# Patient Record
Sex: Female | Born: 1935 | Race: White | Hispanic: No | Smoking: Former smoker
Health system: Southern US, Community
[De-identification: ages and names within clinical notes are randomized; demographics above are authoritative.]

## PROBLEM LIST (undated history)

## (undated) DIAGNOSIS — J449 Chronic obstructive pulmonary disease, unspecified: Secondary | ICD-10-CM

## (undated) DIAGNOSIS — C801 Malignant (primary) neoplasm, unspecified: Secondary | ICD-10-CM

## (undated) DIAGNOSIS — I509 Heart failure, unspecified: Secondary | ICD-10-CM

## (undated) DIAGNOSIS — M1990 Unspecified osteoarthritis, unspecified site: Secondary | ICD-10-CM

## (undated) DIAGNOSIS — E039 Hypothyroidism, unspecified: Secondary | ICD-10-CM

## (undated) DIAGNOSIS — N189 Chronic kidney disease, unspecified: Secondary | ICD-10-CM

## (undated) DIAGNOSIS — R040 Epistaxis: Secondary | ICD-10-CM

## (undated) DIAGNOSIS — Z111 Encounter for screening for respiratory tuberculosis: Secondary | ICD-10-CM

## (undated) DIAGNOSIS — I639 Cerebral infarction, unspecified: Secondary | ICD-10-CM

## (undated) DIAGNOSIS — Z950 Presence of cardiac pacemaker: Secondary | ICD-10-CM

## (undated) DIAGNOSIS — I10 Essential (primary) hypertension: Secondary | ICD-10-CM

## (undated) DIAGNOSIS — K219 Gastro-esophageal reflux disease without esophagitis: Secondary | ICD-10-CM

## (undated) DIAGNOSIS — J45909 Unspecified asthma, uncomplicated: Secondary | ICD-10-CM

## (undated) HISTORY — PX: APPENDECTOMY: SHX54

## (undated) HISTORY — PX: EYE SURGERY: SHX253

## (undated) HISTORY — PX: ABDOMINAL HYSTERECTOMY: SHX81

## (undated) HISTORY — PX: LUMBAR FUSION: SHX111

---
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
## 1989-07-04 DIAGNOSIS — C801 Malignant (primary) neoplasm, unspecified: Secondary | ICD-10-CM

## 1989-07-04 HISTORY — DX: Malignant (primary) neoplasm, unspecified: C80.1

## 1999-11-04 HISTORY — PX: BACK SURGERY: SHX140

## 2000-02-17 ENCOUNTER — Encounter: Admission: RE | Admit: 2000-02-17 | Discharge: 2000-02-17 | Payer: Self-pay | Admitting: Family Medicine

## 2000-02-17 ENCOUNTER — Encounter: Payer: Self-pay | Admitting: Family Medicine

## 2000-03-13 ENCOUNTER — Encounter: Payer: Self-pay | Admitting: Neurosurgery

## 2000-03-18 ENCOUNTER — Encounter: Payer: Self-pay | Admitting: Neurosurgery

## 2000-03-18 ENCOUNTER — Inpatient Hospital Stay (HOSPITAL_COMMUNITY): Admission: RE | Admit: 2000-03-18 | Discharge: 2000-03-21 | Payer: Self-pay | Admitting: Neurosurgery

## 2000-03-19 ENCOUNTER — Encounter: Payer: Self-pay | Admitting: Neurosurgery

## 2008-11-03 DIAGNOSIS — I639 Cerebral infarction, unspecified: Secondary | ICD-10-CM

## 2008-11-03 HISTORY — DX: Cerebral infarction, unspecified: I63.9

## 2015-02-02 HISTORY — PX: OTHER SURGICAL HISTORY: SHX169

## 2015-06-04 HISTORY — PX: INSERT / REPLACE / REMOVE PACEMAKER: SUR710

## 2016-03-13 ENCOUNTER — Other Ambulatory Visit: Payer: Self-pay | Admitting: Neurosurgery

## 2016-04-01 NOTE — Progress Notes (Addendum)
04/01/2016   1525pm  Spoke with son regarding any and all info he could give me today about his mother. PCP -  Dr. Bebe Shaggy    [phone] Pulmonary -  Dr. Alcus Dad  [phone]  Cardiologist - Dr. Coralee Pesa   Electrophysiologist - Dr. Ola Spurr   (both these doctors work for [facility]) [phone] Dr. Ola Spurr inserted a Medtronic pacer back in August 2016.   Last in house pacer check was 10/2015  Nephrologist - Dr. Stormy Fabian  [facility] [phone] Patient had stents placed in kidneys in March 2016  Endocrinologist - Dr. Posey Pronto  [phone] Spoke with [name] from Dr. Windy Carina office.  She is faxing all the info (clearance notes) she received from the above doctors today.  (she said she originally faxed it May 12th, but asked for re-fax) (I also faxed PPM / ICD sheet to [facility])

## 2016-04-01 NOTE — Pre-Procedure Instructions (Signed)
Stacie Roman  04/01/2016      [facility] [address], [place], [id number] [name] [address] Phone: [phone] Fax: [phone]    Your procedure is scheduled on Wednesday, June 7th.             (Posted surgery time 8:30 am - 12:26 pm)   Report to [facility] Admitting at 6:30 AM   Call this number if you have problems the morning of surgery:  [phone]   Remember:  Do not eat food or drink liquids after midnight Tuesday.             4-5 days prior to surgery, STOP taking any vitamins, herbal supplements, anti-inflammatories, blood thinners   Take these medicines the morning of surgery with A SIP OF WATER :   Levothyroxine, Labetalol, Procardia (NIFEDIPINE),  CLONIDINE,Please use your inhalers & nebulizer treatments that morning.   Do not wear jewelry, make-up or nail polish.  Do not wear lotions, powders, or perfumes.     Do not shave underarms & legs 48 hours prior to surgery.    Do not bring valuables to the hospital.  [facility] is not responsible for any belongings or valuables.  Contacts, dentures or bridgework may not be worn into surgery.  Leave your suitcase in the car.  After surgery it may be brought to your room. For patients admitted to the hospital, discharge time will be determined by your treatment team.  Name and phone number of your driver:   son  Please read over the following fact sheets that you were given. Pain Booklet, MRSA Information and Surgical Site Infection Prevention

## 2016-04-02 ENCOUNTER — Encounter (HOSPITAL_COMMUNITY)
Admission: RE | Admit: 2016-04-02 | Discharge: 2016-04-02 | Disposition: A | Discharge status: Home / Self Care | Payer: Medicare HMO | Source: Ambulatory Visit | Attending: Neurosurgery | Admitting: Neurosurgery

## 2016-04-02 ENCOUNTER — Encounter (HOSPITAL_COMMUNITY): Payer: Self-pay

## 2016-04-02 DIAGNOSIS — M4806 Spinal stenosis, lumbar region: Secondary | ICD-10-CM | POA: Insufficient documentation

## 2016-04-02 DIAGNOSIS — Z0183 Encounter for blood typing: Secondary | ICD-10-CM | POA: Insufficient documentation

## 2016-04-02 DIAGNOSIS — Z01812 Encounter for preprocedural laboratory examination: Secondary | ICD-10-CM | POA: Diagnosis not present

## 2016-04-02 HISTORY — DX: Malignant (primary) neoplasm, unspecified: C80.1

## 2016-04-02 HISTORY — DX: Hypothyroidism, unspecified: E03.9

## 2016-04-02 HISTORY — DX: Presence of cardiac pacemaker: Z95.0

## 2016-04-02 HISTORY — DX: Essential (primary) hypertension: I10

## 2016-04-02 HISTORY — DX: Cerebral infarction, unspecified: I63.9

## 2016-04-02 HISTORY — DX: Heart failure, unspecified: I50.9

## 2016-04-02 HISTORY — DX: Encounter for screening for respiratory tuberculosis: Z11.1

## 2016-04-02 HISTORY — DX: Unspecified osteoarthritis, unspecified site: M19.90

## 2016-04-02 HISTORY — DX: Chronic obstructive pulmonary disease, unspecified: J44.9

## 2016-04-02 HISTORY — DX: Gastro-esophageal reflux disease without esophagitis: K21.9

## 2016-04-02 HISTORY — DX: Epistaxis: R04.0

## 2016-04-02 HISTORY — DX: Chronic kidney disease, unspecified: N18.9

## 2016-04-02 HISTORY — DX: Unspecified asthma, uncomplicated: J45.909

## 2016-04-02 LAB — CBC
HEMATOCRIT: 31.3 % — AB (ref 36.0–46.0)
HEMOGLOBIN: 10.1 g/dL — AB (ref 12.0–15.0)
MCH: 30.1 pg (ref 26.0–34.0)
MCHC: 32.3 g/dL (ref 30.0–36.0)
MCV: 93.4 fL (ref 78.0–100.0)
Platelets: 290 10*3/uL (ref 150–400)
RBC: 3.35 MIL/uL — ABNORMAL LOW (ref 3.87–5.11)
RDW: 12.8 % (ref 11.5–15.5)
WBC: 8.6 10*3/uL (ref 4.0–10.5)

## 2016-04-02 LAB — BASIC METABOLIC PANEL
ANION GAP: 9 (ref 5–15)
BUN: 40 mg/dL — AB (ref 6–20)
CHLORIDE: 102 mmol/L (ref 101–111)
CO2: 25 mmol/L (ref 22–32)
Calcium: 9.4 mg/dL (ref 8.9–10.3)
Creatinine, Ser: 2.58 mg/dL — ABNORMAL HIGH (ref 0.44–1.00)
GFR calc Af Amer: 19 mL/min — ABNORMAL LOW (ref >60)
GFR calc non Af Amer: 16 mL/min — ABNORMAL LOW (ref >60)
Glucose, Bld: 101 mg/dL — ABNORMAL HIGH (ref 65–99)
POTASSIUM: 3.8 mmol/L (ref 3.5–5.1)
SODIUM: 136 mmol/L (ref 135–145)

## 2016-04-02 LAB — SURGICAL PCR SCREEN
MRSA, PCR: NEGATIVE
STAPHYLOCOCCUS AUREUS: NEGATIVE

## 2016-04-02 LAB — ABO/RH: ABO/RH(D): AB POS

## 2016-04-02 NOTE — Progress Notes (Signed)
A. Kabbe,anesth. Consult called for due to MD order & alert of her medical needs.

## 2016-04-03 ENCOUNTER — Encounter (HOSPITAL_COMMUNITY): Payer: Self-pay

## 2016-04-03 NOTE — Progress Notes (Signed)
Anesthesia Chart Review:  Pt is an 82 year old female scheduled for L3-4 PLIF, removal of hardware on 04/09/2016 with Dr. Saintclair Halsted.   Nephrologist is Dr. Thereasa Distance. PCP is Dr. [name]. Sees Elijio Miles, PA/Dr. Loletha Carrow with pulmonology. Endocrinologist is Dr. Tonette Bihari.  Cardiologist is Dr. Milana Huntsman. EP cardiologist is Dr. Adrian Prows. (Note that documentation from all providers can be found in care everywhere).   PMH includes:  CHF, pacemaker (Medtronic, placed 06/15/15), HTN, stroke (2010), CKD (stage 4), B renal artery stenosis (s/p B renal stents), asthma, COPD, hypothyroidsim, treated forTB in 1950's, GERD. Former smoker. BMI 35  Medications include: ASA, clonidine, dulera, imdur, labetalol, levothyroxine, nifedipine, torsemide, albuterol  Preoperative labs reviewed.  Cr 2.58, BUN 40. Clearance note from nephrology indicates pt's baseline Cr is 2.9.   Chest x-ray 06/16/15 reviewed (care everywhere). No active cardiopulmonary disease. Dual lead cardiac pacemaker in place.   EKG 01/24/16 requested. If it does not arrive in time, will need to obtain EKG DOS.   Echo 12/21/14 (care everywhere):  - The left ventricular size is normal. Basal left ventricular septal hypertrophy.  Left ventricular systolic function is normal. LV ejection fraction = 55-60%. No segmental wall motion abnormalities seen in the left ventricle. Left ventricular filling pattern is impaired. - The right ventricle is normal in size and function. - There is mild mitral annular calcification. - The aortic root is normal size. - The IVC is normal in size with an inspiratory collapse of greater then 50%, suggesting normal right atrial pressure. - There is no pericardial effusion.  Pt has clearance from her PCP, cardiologist, pulmonologist (who recommends a post-op CXR), nephrologist and endocrinologist.   Perioperative pacemaker form pending.   If no changes, I anticipate pt can proceed  with surgery as scheduled.   Willeen Cass, FNP-BC [facility]/Anesthesiology Phone: [phone] 04/03/2016 1:44 PM

## 2016-04-08 MED ORDER — DEXAMETHASONE SODIUM PHOSPHATE 10 MG/ML IJ SOLN
10.0000 mg | INTRAMUSCULAR | Status: DC
  Filled 2016-04-08: qty 1

## 2016-04-08 MED ORDER — CEFAZOLIN SODIUM-DEXTROSE 2-4 GM/100ML-% IV SOLN
2.0000 g | INTRAVENOUS | Status: AC
  Administered 2016-04-09: 2 g via INTRAVENOUS
  Filled 2016-04-08: qty 100

## 2016-04-09 ENCOUNTER — Encounter (HOSPITAL_COMMUNITY)
Admission: RE | Disposition: A | Discharge status: Home Health | Payer: Self-pay | Source: Ambulatory Visit | Attending: Neurosurgery

## 2016-04-09 ENCOUNTER — Inpatient Hospital Stay (HOSPITAL_COMMUNITY): Payer: Medicare HMO | Admitting: Anesthesiology

## 2016-04-09 ENCOUNTER — Inpatient Hospital Stay (HOSPITAL_COMMUNITY): Payer: Medicare HMO | Admitting: Emergency Medicine

## 2016-04-09 ENCOUNTER — Inpatient Hospital Stay (HOSPITAL_COMMUNITY): Payer: Medicare HMO

## 2016-04-09 ENCOUNTER — Encounter (HOSPITAL_COMMUNITY): Payer: Self-pay | Admitting: Surgery

## 2016-04-09 ENCOUNTER — Inpatient Hospital Stay (HOSPITAL_COMMUNITY)
Admission: RE | Admit: 2016-04-09 | Discharge: 2016-04-20 | DRG: 460 | Disposition: A | Discharge status: Home Health | Payer: Medicare HMO | Source: Ambulatory Visit | Attending: Neurosurgery | Admitting: Neurosurgery

## 2016-04-09 DIAGNOSIS — J449 Chronic obstructive pulmonary disease, unspecified: Secondary | ICD-10-CM | POA: Diagnosis present

## 2016-04-09 DIAGNOSIS — D649 Anemia, unspecified: Secondary | ICD-10-CM | POA: Diagnosis not present

## 2016-04-09 DIAGNOSIS — Y792 Prosthetic and other implants, materials and accessory orthopedic devices associated with adverse incidents: Secondary | ICD-10-CM | POA: Diagnosis not present

## 2016-04-09 DIAGNOSIS — N184 Chronic kidney disease, stage 4 (severe): Secondary | ICD-10-CM | POA: Diagnosis present

## 2016-04-09 DIAGNOSIS — Z87891 Personal history of nicotine dependence: Secondary | ICD-10-CM | POA: Diagnosis not present

## 2016-04-09 DIAGNOSIS — I130 Hypertensive heart and chronic kidney disease with heart failure and stage 1 through stage 4 chronic kidney disease, or unspecified chronic kidney disease: Secondary | ICD-10-CM | POA: Diagnosis present

## 2016-04-09 DIAGNOSIS — Z8673 Personal history of transient ischemic attack (TIA), and cerebral infarction without residual deficits: Secondary | ICD-10-CM

## 2016-04-09 DIAGNOSIS — T84226A Displacement of internal fixation device of vertebrae, initial encounter: Secondary | ICD-10-CM | POA: Diagnosis not present

## 2016-04-09 DIAGNOSIS — Z885 Allergy status to narcotic agent status: Secondary | ICD-10-CM | POA: Diagnosis not present

## 2016-04-09 DIAGNOSIS — N179 Acute kidney failure, unspecified: Secondary | ICD-10-CM

## 2016-04-09 DIAGNOSIS — Z886 Allergy status to analgesic agent status: Secondary | ICD-10-CM | POA: Diagnosis not present

## 2016-04-09 DIAGNOSIS — D62 Acute posthemorrhagic anemia: Secondary | ICD-10-CM | POA: Diagnosis not present

## 2016-04-09 DIAGNOSIS — K5900 Constipation, unspecified: Secondary | ICD-10-CM | POA: Diagnosis not present

## 2016-04-09 DIAGNOSIS — I509 Heart failure, unspecified: Secondary | ICD-10-CM

## 2016-04-09 DIAGNOSIS — Z79899 Other long term (current) drug therapy: Secondary | ICD-10-CM | POA: Diagnosis not present

## 2016-04-09 DIAGNOSIS — Y92239 Unspecified place in hospital as the place of occurrence of the external cause: Secondary | ICD-10-CM | POA: Diagnosis not present

## 2016-04-09 DIAGNOSIS — E039 Hypothyroidism, unspecified: Secondary | ICD-10-CM | POA: Diagnosis present

## 2016-04-09 DIAGNOSIS — Z9981 Dependence on supplemental oxygen: Secondary | ICD-10-CM | POA: Diagnosis not present

## 2016-04-09 DIAGNOSIS — K567 Ileus, unspecified: Secondary | ICD-10-CM

## 2016-04-09 DIAGNOSIS — R531 Weakness: Secondary | ICD-10-CM

## 2016-04-09 DIAGNOSIS — M1990 Unspecified osteoarthritis, unspecified site: Secondary | ICD-10-CM | POA: Diagnosis present

## 2016-04-09 DIAGNOSIS — M4316 Spondylolisthesis, lumbar region: Secondary | ICD-10-CM | POA: Diagnosis present

## 2016-04-09 DIAGNOSIS — M4806 Spinal stenosis, lumbar region: Secondary | ICD-10-CM | POA: Diagnosis present

## 2016-04-09 DIAGNOSIS — Z7982 Long term (current) use of aspirin: Secondary | ICD-10-CM | POA: Diagnosis not present

## 2016-04-09 DIAGNOSIS — Z419 Encounter for procedure for purposes other than remedying health state, unspecified: Secondary | ICD-10-CM

## 2016-04-09 DIAGNOSIS — K219 Gastro-esophageal reflux disease without esophagitis: Secondary | ICD-10-CM | POA: Diagnosis present

## 2016-04-09 DIAGNOSIS — M47816 Spondylosis without myelopathy or radiculopathy, lumbar region: Secondary | ICD-10-CM | POA: Diagnosis present

## 2016-04-09 DIAGNOSIS — Z888 Allergy status to other drugs, medicaments and biological substances status: Secondary | ICD-10-CM

## 2016-04-09 DIAGNOSIS — I5030 Unspecified diastolic (congestive) heart failure: Secondary | ICD-10-CM | POA: Diagnosis not present

## 2016-04-09 DIAGNOSIS — I5032 Chronic diastolic (congestive) heart failure: Secondary | ICD-10-CM | POA: Diagnosis present

## 2016-04-09 SURGERY — POSTERIOR LUMBAR FUSION 1 WITH HARDWARE REMOVAL
Anesthesia: General | Site: Spine Lumbar

## 2016-04-09 MED ORDER — HYDROMORPHONE HCL 1 MG/ML IJ SOLN
0.5000 mg | INTRAMUSCULAR | Status: DC | PRN
  Administered 2016-04-09 – 2016-04-16 (×6): 1 mg via INTRAVENOUS
  Filled 2016-04-09 (×7): qty 1

## 2016-04-09 MED ORDER — ISOSORBIDE MONONITRATE ER 60 MG PO TB24
90.0000 mg | ORAL_TABLET | Freq: Every day | ORAL | Status: DC
  Administered 2016-04-09 – 2016-04-19 (×10): 90 mg via ORAL
  Filled 2016-04-09 (×2): qty 3
  Filled 2016-04-09: qty 1
  Filled 2016-04-09 (×3): qty 3
  Filled 2016-04-09: qty 1
  Filled 2016-04-09: qty 3
  Filled 2016-04-09: qty 1
  Filled 2016-04-09: qty 3

## 2016-04-09 MED ORDER — ISOSORBIDE MONONITRATE ER 60 MG PO TB24: 60.0000 mg | ORAL_TABLET | Freq: Every day | ORAL | Status: DC

## 2016-04-09 MED ORDER — BISACODYL 5 MG PO TBEC: 5.0000 mg | DELAYED_RELEASE_TABLET | Freq: Two times a day (BID) | ORAL | Status: DC

## 2016-04-09 MED ORDER — BUPIVACAINE LIPOSOME 1.3 % IJ SUSP
INTRAMUSCULAR | Status: DC | PRN
  Administered 2016-04-09: 20 mL

## 2016-04-09 MED ORDER — LEVOTHYROXINE SODIUM 50 MCG PO TABS
50.0000 ug | ORAL_TABLET | Freq: Every day | ORAL | Status: DC
  Administered 2016-04-10 – 2016-04-19 (×10): 50 ug via ORAL
  Filled 2016-04-09 (×10): qty 1

## 2016-04-09 MED ORDER — ACETAMINOPHEN 650 MG RE SUPP: 650.0000 mg | RECTAL | Status: DC | PRN

## 2016-04-09 MED ORDER — VANCOMYCIN HCL 1000 MG IV SOLR
INTRAVENOUS | Status: DC | PRN
  Administered 2016-04-09: 1000 mg via TOPICAL

## 2016-04-09 MED ORDER — PROPOFOL 10 MG/ML IV BOLUS
INTRAVENOUS | Status: DC | PRN
  Administered 2016-04-09: 150 mg via INTRAVENOUS

## 2016-04-09 MED ORDER — ONDANSETRON HCL 4 MG/2ML IJ SOLN
INTRAMUSCULAR | Status: AC
  Filled 2016-04-09: qty 2

## 2016-04-09 MED ORDER — DOCUSATE SODIUM 100 MG PO CAPS
200.0000 mg | ORAL_CAPSULE | Freq: Two times a day (BID) | ORAL | Status: DC
  Administered 2016-04-09 – 2016-04-20 (×22): 200 mg via ORAL
  Filled 2016-04-09 (×23): qty 2

## 2016-04-09 MED ORDER — ACETAMINOPHEN 325 MG PO TABS
650.0000 mg | ORAL_TABLET | ORAL | Status: DC | PRN
  Administered 2016-04-14 – 2016-04-20 (×12): 650 mg via ORAL
  Filled 2016-04-09 (×12): qty 2

## 2016-04-09 MED ORDER — ROCURONIUM BROMIDE 50 MG/5ML IV SOLN
INTRAVENOUS | Status: AC
  Filled 2016-04-09: qty 2

## 2016-04-09 MED ORDER — ALBUTEROL SULFATE (2.5 MG/3ML) 0.083% IN NEBU
2.0000 mL | INHALATION_SOLUTION | Freq: Four times a day (QID) | RESPIRATORY_TRACT | Status: DC | PRN
  Administered 2016-04-15 – 2016-04-19 (×3): 2 mL via RESPIRATORY_TRACT
  Filled 2016-04-09 (×3): qty 3

## 2016-04-09 MED ORDER — PHENOL 1.4 % MT LIQD: 1.0000 | OROMUCOSAL | Status: DC | PRN

## 2016-04-09 MED ORDER — FENTANYL CITRATE (PF) 100 MCG/2ML IJ SOLN
INTRAMUSCULAR | Status: AC
  Administered 2016-04-09: 50 ug via INTRAVENOUS
  Filled 2016-04-09: qty 2

## 2016-04-09 MED ORDER — SODIUM CHLORIDE 0.9% FLUSH: 3.0000 mL | INTRAVENOUS | Status: DC | PRN

## 2016-04-09 MED ORDER — ACETAMINOPHEN 500 MG PO TABS
1000.0000 mg | ORAL_TABLET | ORAL | Status: DC
  Administered 2016-04-11 – 2016-04-16 (×6): 1000 mg via ORAL
  Filled 2016-04-09 (×7): qty 2

## 2016-04-09 MED ORDER — OXYCODONE-ACETAMINOPHEN 5-325 MG PO TABS
1.0000 | ORAL_TABLET | ORAL | Status: DC | PRN
  Administered 2016-04-09: 2 via ORAL
  Administered 2016-04-09: 1 via ORAL
  Administered 2016-04-10 – 2016-04-12 (×8): 2 via ORAL
  Administered 2016-04-15 (×2): 1 via ORAL
  Filled 2016-04-09 (×8): qty 2
  Filled 2016-04-09: qty 1
  Filled 2016-04-09 (×3): qty 2

## 2016-04-09 MED ORDER — FENTANYL CITRATE (PF) 250 MCG/5ML IJ SOLN
INTRAMUSCULAR | Status: AC
  Filled 2016-04-09: qty 10

## 2016-04-09 MED ORDER — LIDOCAINE HCL (CARDIAC) 20 MG/ML IV SOLN
INTRAVENOUS | Status: DC | PRN
  Administered 2016-04-09: 40 mg via INTRAVENOUS

## 2016-04-09 MED ORDER — OSTEO BI-FLEX ADV JOINT SHIELD PO TABS: 2.0000 | ORAL_TABLET | ORAL | Status: DC

## 2016-04-09 MED ORDER — OXYBUTYNIN CHLORIDE ER 10 MG PO TB24
10.0000 mg | ORAL_TABLET | Freq: Every day | ORAL | Status: DC
  Administered 2016-04-09 – 2016-04-19 (×10): 10 mg via ORAL
  Filled 2016-04-09 (×12): qty 1

## 2016-04-09 MED ORDER — ROCURONIUM BROMIDE 100 MG/10ML IV SOLN
INTRAVENOUS | Status: DC | PRN
  Administered 2016-04-09: 50 mg via INTRAVENOUS
  Administered 2016-04-09 (×2): 10 mg via INTRAVENOUS

## 2016-04-09 MED ORDER — BUPIVACAINE LIPOSOME 1.3 % IJ SUSP
20.0000 mL | Freq: Once | INTRAMUSCULAR | Status: DC
  Filled 2016-04-09: qty 20

## 2016-04-09 MED ORDER — MENTHOL 3 MG MT LOZG: 1.0000 | LOZENGE | OROMUCOSAL | Status: DC | PRN

## 2016-04-09 MED ORDER — LIDOCAINE-EPINEPHRINE 1 %-1:100000 IJ SOLN
INTRAMUSCULAR | Status: DC | PRN
  Administered 2016-04-09: 10 mL

## 2016-04-09 MED ORDER — LACTATED RINGERS IV SOLN
INTRAVENOUS | Status: DC | PRN
  Administered 2016-04-09 (×2): via INTRAVENOUS

## 2016-04-09 MED ORDER — MOMETASONE FURO-FORMOTEROL FUM 200-5 MCG/ACT IN AERO
2.0000 | INHALATION_SPRAY | Freq: Two times a day (BID) | RESPIRATORY_TRACT | Status: DC
  Administered 2016-04-09 – 2016-04-19 (×17): 2 via RESPIRATORY_TRACT
  Filled 2016-04-09: qty 8.8

## 2016-04-09 MED ORDER — FENTANYL CITRATE (PF) 100 MCG/2ML IJ SOLN
INTRAMUSCULAR | Status: DC | PRN
  Administered 2016-04-09: 50 ug via INTRAVENOUS
  Administered 2016-04-09: 100 ug via INTRAVENOUS
  Administered 2016-04-09 (×4): 50 ug via INTRAVENOUS

## 2016-04-09 MED ORDER — DEXAMETHASONE SODIUM PHOSPHATE 10 MG/ML IJ SOLN
INTRAMUSCULAR | Status: DC | PRN
  Administered 2016-04-09: 10 mg via INTRAVENOUS

## 2016-04-09 MED ORDER — BISACODYL 5 MG PO TBEC
10.0000 mg | DELAYED_RELEASE_TABLET | Freq: Every day | ORAL | Status: DC
  Administered 2016-04-09 – 2016-04-19 (×11): 10 mg via ORAL
  Filled 2016-04-09 (×11): qty 2

## 2016-04-09 MED ORDER — NIFEDIPINE ER 60 MG PO TB24
60.0000 mg | ORAL_TABLET | Freq: Two times a day (BID) | ORAL | Status: DC
  Administered 2016-04-10 – 2016-04-20 (×20): 60 mg via ORAL
  Filled 2016-04-09 (×26): qty 1

## 2016-04-09 MED ORDER — FENTANYL CITRATE (PF) 100 MCG/2ML IJ SOLN
25.0000 ug | INTRAMUSCULAR | Status: DC | PRN
  Administered 2016-04-09: 25 ug via INTRAVENOUS
  Administered 2016-04-09 (×2): 50 ug via INTRAVENOUS
  Administered 2016-04-09: 25 ug via INTRAVENOUS
  Administered 2016-04-09: 50 ug via INTRAVENOUS

## 2016-04-09 MED ORDER — LIDOCAINE 2% (20 MG/ML) 5 ML SYRINGE
INTRAMUSCULAR | Status: AC
  Filled 2016-04-09: qty 5

## 2016-04-09 MED ORDER — ONDANSETRON HCL 4 MG/2ML IJ SOLN
4.0000 mg | INTRAMUSCULAR | Status: DC | PRN
  Administered 2016-04-13: 4 mg via INTRAVENOUS
  Filled 2016-04-09: qty 2

## 2016-04-09 MED ORDER — VANCOMYCIN HCL 1000 MG IV SOLR
INTRAVENOUS | Status: AC
  Filled 2016-04-09: qty 1000

## 2016-04-09 MED ORDER — SODIUM CHLORIDE 0.9 % IV SOLN: 250.0000 mL | INTRAVENOUS | Status: DC

## 2016-04-09 MED ORDER — SUGAMMADEX SODIUM 200 MG/2ML IV SOLN
INTRAVENOUS | Status: DC | PRN
  Administered 2016-04-09: 200 mg via INTRAVENOUS

## 2016-04-09 MED ORDER — DOCUSATE SODIUM 100 MG PO CAPS: 100.0000 mg | ORAL_CAPSULE | Freq: Two times a day (BID) | ORAL | Status: DC

## 2016-04-09 MED ORDER — LABETALOL HCL 200 MG PO TABS
400.0000 mg | ORAL_TABLET | Freq: Three times a day (TID) | ORAL | Status: DC
  Administered 2016-04-09 – 2016-04-20 (×29): 400 mg via ORAL
  Filled 2016-04-09 (×2): qty 2
  Filled 2016-04-09: qty 4
  Filled 2016-04-09 (×6): qty 2
  Filled 2016-04-09: qty 4
  Filled 2016-04-09 (×3): qty 2
  Filled 2016-04-09: qty 4
  Filled 2016-04-09 (×18): qty 2

## 2016-04-09 MED ORDER — ACYCLOVIR 800 MG PO TABS: 800.0000 mg | ORAL_TABLET | Freq: Three times a day (TID) | ORAL | Status: DC | PRN

## 2016-04-09 MED ORDER — 0.9 % SODIUM CHLORIDE (POUR BTL) OPTIME
TOPICAL | Status: DC | PRN
  Administered 2016-04-09: 1000 mL

## 2016-04-09 MED ORDER — ONDANSETRON HCL 4 MG/2ML IJ SOLN
4.0000 mg | Freq: Once | INTRAMUSCULAR | Status: AC | PRN
  Administered 2016-04-09: 4 mg via INTRAVENOUS

## 2016-04-09 MED ORDER — BISACODYL 5 MG PO TBEC
5.0000 mg | DELAYED_RELEASE_TABLET | Freq: Every day | ORAL | Status: DC
  Administered 2016-04-09 – 2016-04-19 (×11): 5 mg via ORAL
  Filled 2016-04-09 (×12): qty 1

## 2016-04-09 MED ORDER — ONDANSETRON HCL 4 MG/2ML IJ SOLN
INTRAMUSCULAR | Status: DC | PRN
  Administered 2016-04-09: 4 mg via INTRAVENOUS

## 2016-04-09 MED ORDER — LORATADINE 10 MG PO TABS
10.0000 mg | ORAL_TABLET | ORAL | Status: DC
  Administered 2016-04-10 – 2016-04-20 (×11): 10 mg via ORAL
  Filled 2016-04-09 (×12): qty 1

## 2016-04-09 MED ORDER — CYCLOBENZAPRINE HCL 10 MG PO TABS
10.0000 mg | ORAL_TABLET | Freq: Three times a day (TID) | ORAL | Status: DC | PRN
  Administered 2016-04-09 – 2016-04-18 (×12): 10 mg via ORAL
  Filled 2016-04-09 (×14): qty 1

## 2016-04-09 MED ORDER — ASPIRIN EC 81 MG PO TBEC
81.0000 mg | DELAYED_RELEASE_TABLET | ORAL | Status: DC
  Administered 2016-04-10 – 2016-04-20 (×11): 81 mg via ORAL
  Filled 2016-04-09 (×14): qty 1

## 2016-04-09 MED ORDER — CLONIDINE HCL 0.1 MG PO TABS
0.1000 mg | ORAL_TABLET | Freq: Two times a day (BID) | ORAL | Status: DC
  Administered 2016-04-10 – 2016-04-20 (×21): 0.1 mg via ORAL
  Filled 2016-04-09 (×23): qty 1

## 2016-04-09 MED ORDER — TORSEMIDE 20 MG PO TABS
40.0000 mg | ORAL_TABLET | ORAL | Status: DC
  Administered 2016-04-10 – 2016-04-14 (×5): 40 mg via ORAL
  Filled 2016-04-09 (×7): qty 2

## 2016-04-09 MED ORDER — THROMBIN 20000 UNITS EX SOLR
CUTANEOUS | Status: DC | PRN
  Administered 2016-04-09: 10:00:00 via TOPICAL

## 2016-04-09 MED ORDER — SODIUM CHLORIDE 0.9% FLUSH
3.0000 mL | Freq: Two times a day (BID) | INTRAVENOUS | Status: DC
  Administered 2016-04-09 – 2016-04-14 (×7): 3 mL via INTRAVENOUS

## 2016-04-09 MED ORDER — SODIUM CHLORIDE 0.9 % IR SOLN
Status: DC | PRN
  Administered 2016-04-09: 10:00:00

## 2016-04-09 MED ORDER — CEFAZOLIN SODIUM 1-5 GM-% IV SOLN
1.0000 g | Freq: Two times a day (BID) | INTRAVENOUS | Status: AC
  Administered 2016-04-09 – 2016-04-11 (×4): 1 g via INTRAVENOUS
  Filled 2016-04-09 (×4): qty 50

## 2016-04-09 MED ORDER — PROPOFOL 10 MG/ML IV BOLUS
INTRAVENOUS | Status: AC
  Filled 2016-04-09: qty 20

## 2016-04-09 MED ORDER — SENNOSIDES-DOCUSATE SODIUM 8.6-50 MG PO TABS
1.0000 | ORAL_TABLET | Freq: Every evening | ORAL | Status: DC | PRN
  Administered 2016-04-18: 1 via ORAL
  Filled 2016-04-09 (×2): qty 1

## 2016-04-09 MED ORDER — VITAMIN D 1000 UNITS PO TABS
5000.0000 [IU] | ORAL_TABLET | ORAL | Status: DC
  Administered 2016-04-10 – 2016-04-20 (×10): 5000 [IU] via ORAL
  Filled 2016-04-09 (×14): qty 5

## 2016-04-09 MED ORDER — MIDAZOLAM HCL 2 MG/2ML IJ SOLN
INTRAMUSCULAR | Status: AC
  Filled 2016-04-09: qty 2

## 2016-04-09 SURGICAL SUPPLY — 73 items
APL SKNCLS STERI-STRIP NONHPOA (GAUZE/BANDAGES/DRESSINGS) ×1
BAG DECANTER FOR FLEXI CONT (MISCELLANEOUS) ×3 IMPLANT
BENZOIN TINCTURE PRP APPL 2/3 (GAUZE/BANDAGES/DRESSINGS) ×3 IMPLANT
BLADE CLIPPER SURG (BLADE) IMPLANT
BLADE SURG 11 STRL SS (BLADE) ×3 IMPLANT
BRUSH SCRUB EZ PLAIN DRY (MISCELLANEOUS) ×3 IMPLANT
BUR MATCHSTICK NEURO 3.0 LAGG (BURR) ×3 IMPLANT
BUR PRECISION FLUTE 6.0 (BURR) ×3 IMPLANT
CAGE RISE 11-17-15 10X22 (Cage) ×4 IMPLANT
CANISTER SUCT 3000ML PPV (MISCELLANEOUS) ×3 IMPLANT
CAP LOCKING THREADED (Cap) ×8 IMPLANT
CLOSURE WOUND 1/2 X4 (GAUZE/BANDAGES/DRESSINGS) ×1
CONT SPEC 4OZ CLIKSEAL STRL BL (MISCELLANEOUS) ×3 IMPLANT
COVER BACK TABLE 24X17X13 BIG (DRAPES) IMPLANT
COVER BACK TABLE 60X90IN (DRAPES) ×3 IMPLANT
DECANTER SPIKE VIAL GLASS SM (MISCELLANEOUS) ×1 IMPLANT
DRAPE C-ARM 42X72 X-RAY (DRAPES) ×3 IMPLANT
DRAPE C-ARMOR (DRAPES) ×3 IMPLANT
DRAPE LAPAROTOMY 100X72X124 (DRAPES) ×3 IMPLANT
DRAPE POUCH INSTRU U-SHP 10X18 (DRAPES) ×3 IMPLANT
DRAPE PROXIMA HALF (DRAPES) ×4 IMPLANT
DRAPE SURG 17X23 STRL (DRAPES) ×3 IMPLANT
DRSG OPSITE 4X5.5 SM (GAUZE/BANDAGES/DRESSINGS) ×2 IMPLANT
DRSG OPSITE POSTOP 4X8 (GAUZE/BANDAGES/DRESSINGS) ×2 IMPLANT
DURAPREP 26ML APPLICATOR (WOUND CARE) ×3 IMPLANT
ELECT BLADE 4.0 EZ CLEAN MEGAD (MISCELLANEOUS) ×3
ELECT REM PT RETURN 9FT ADLT (ELECTROSURGICAL) ×3
ELECTRODE BLDE 4.0 EZ CLN MEGD (MISCELLANEOUS) IMPLANT
ELECTRODE REM PT RTRN 9FT ADLT (ELECTROSURGICAL) ×1 IMPLANT
EVACUATOR 3/16  PVC DRAIN (DRAIN) ×2
EVACUATOR 3/16 PVC DRAIN (DRAIN) ×1 IMPLANT
GAUZE SPONGE 4X4 12PLY STRL (GAUZE/BANDAGES/DRESSINGS) ×3 IMPLANT
GAUZE SPONGE 4X4 16PLY XRAY LF (GAUZE/BANDAGES/DRESSINGS) ×2 IMPLANT
GLOVE BIO SURGEON STRL SZ8 (GLOVE) ×6 IMPLANT
GLOVE BIOGEL PI IND STRL 7.5 (GLOVE) IMPLANT
GLOVE BIOGEL PI INDICATOR 7.5 (GLOVE) ×4
GLOVE ECLIPSE 7.5 STRL STRAW (GLOVE) IMPLANT
GLOVE INDICATOR 8.5 STRL (GLOVE) ×6 IMPLANT
GLOVE SURG SS PI 6.5 STRL IVOR (GLOVE) ×6 IMPLANT
GLOVE SURG SS PI 7.0 STRL IVOR (GLOVE) ×2 IMPLANT
GOWN STRL REUS W/ TWL LRG LVL3 (GOWN DISPOSABLE) IMPLANT
GOWN STRL REUS W/ TWL XL LVL3 (GOWN DISPOSABLE) ×2 IMPLANT
GOWN STRL REUS W/TWL 2XL LVL3 (GOWN DISPOSABLE) IMPLANT
GOWN STRL REUS W/TWL LRG LVL3 (GOWN DISPOSABLE) ×6
GOWN STRL REUS W/TWL XL LVL3 (GOWN DISPOSABLE) ×6
KIT BASIN OR (CUSTOM PROCEDURE TRAY) ×3 IMPLANT
KIT INFUSE SMALL (Orthopedic Implant) ×2 IMPLANT
KIT ROOM TURNOVER OR (KITS) ×3 IMPLANT
LIQUID BAND (GAUZE/BANDAGES/DRESSINGS) ×3 IMPLANT
NDL HYPO 21X1.5 SAFETY (NEEDLE) ×1 IMPLANT
NDL HYPO 25X1 1.5 SAFETY (NEEDLE) ×1 IMPLANT
NEEDLE HYPO 21X1.5 SAFETY (NEEDLE) ×3 IMPLANT
NEEDLE HYPO 25X1 1.5 SAFETY (NEEDLE) ×3 IMPLANT
NS IRRIG 1000ML POUR BTL (IV SOLUTION) ×3 IMPLANT
PACK LAMINECTOMY NEURO (CUSTOM PROCEDURE TRAY) ×3 IMPLANT
PAD ARMBOARD 7.5X6 YLW CONV (MISCELLANEOUS) ×9 IMPLANT
PUTTY BONE DBX 5CC MIX (Putty) ×2 IMPLANT
ROD 40MM SPINAL (Rod) ×4 IMPLANT
SCREW AMP MODULAR CREO 6.5X45 (Screw) ×4 IMPLANT
SCREW SPINE CREO AMP 6.5X50 (Screw) ×4 IMPLANT
SPONGE LAP 4X18 X RAY DECT (DISPOSABLE) IMPLANT
SPONGE SURGIFOAM ABS GEL 100 (HEMOSTASIS) ×3 IMPLANT
STRIP CLOSURE SKIN 1/2X4 (GAUZE/BANDAGES/DRESSINGS) ×3 IMPLANT
SUT VIC AB 0 CT1 18XCR BRD8 (SUTURE) ×2 IMPLANT
SUT VIC AB 0 CT1 8-18 (SUTURE) ×6
SUT VIC AB 2-0 CT1 18 (SUTURE) ×3 IMPLANT
SUT VIC AB 4-0 PS2 27 (SUTURE) ×3 IMPLANT
SYR 20CC LL (SYRINGE) ×2 IMPLANT
TOWEL OR 17X24 6PK STRL BLUE (TOWEL DISPOSABLE) ×3 IMPLANT
TOWEL OR 17X26 10 PK STRL BLUE (TOWEL DISPOSABLE) ×3 IMPLANT
TRAY FOLEY W/METER SILVER 16FR (SET/KITS/TRAYS/PACK) ×3 IMPLANT
TULIP CREP AMP 5.5MM (Orthopedic Implant) ×8 IMPLANT
WATER STERILE IRR 1000ML POUR (IV SOLUTION) ×3 IMPLANT

## 2016-04-09 NOTE — Anesthesia Postprocedure Evaluation (Signed)
Anesthesia Post Note  Patient: Stacie Roman  Procedure(s) Performed: Procedure(s) (LRB): LUMBAR THREE-FOUR POSTERIOR LUMBAR FUSION WITH INTERBODY AND HARDWARE REMOVAL  (N/A)  Patient location during evaluation: PACU Anesthesia Type: General Level of consciousness: awake, awake and alert and oriented Pain management: pain level controlled Vital Signs Assessment: post-procedure vital signs reviewed and stable Respiratory status: spontaneous breathing, nonlabored ventilation and respiratory function stable Cardiovascular status: blood pressure returned to baseline Anesthetic complications: no    Last Vitals:  Filed Vitals:   04/09/16 1500 04/09/16 1615  BP:  220/71  Pulse: 59 63  Temp: 36.2 C   Resp: 16 18    Last Pain:  Filed Vitals:   04/09/16 1750  PainSc: 10-Worst pain ever                 Delenn Ahn COKER

## 2016-04-09 NOTE — Anesthesia Procedure Notes (Signed)
Procedure Name: Intubation Date/Time: 04/09/2016 8:45 AM Performed by: Shirlyn Goltz Pre-anesthesia Checklist: Patient identified, Emergency Drugs available, Suction available and Patient being monitored Patient Re-evaluated:Patient Re-evaluated prior to inductionOxygen Delivery Method: Circle system utilized Preoxygenation: Pre-oxygenation with 100% oxygen Intubation Type: IV induction Ventilation: Mask ventilation without difficulty and Oral airway inserted - appropriate to patient size Laryngoscope Size: Mac and 3 Grade View: Grade II Tube type: Oral Tube size: 7.0 mm Number of attempts: 1 Airway Equipment and Method: Stylet Placement Confirmation: ETT inserted through vocal cords under direct vision,  positive ETCO2 and breath sounds checked- equal and bilateral Secured at: 22 cm Tube secured with: Tape Dental Injury: Teeth and Oropharynx as per pre-operative assessment

## 2016-04-09 NOTE — Op Note (Signed)
Preoperative diagnosis: Segmental degeneration and severe lumbar spinal stenosis L3-4 with bilateral L4 radiculopathies and a grade 1 spondylolisthesis L3-4.  Postoperative diagnosis: Same  Procedure: #1 reexploration of fusion removal of hardware L4-5 with removal of TSRH 3-D pedicle screw system.  #2 decompressive lumbar laminotomy L3-4 and excess and requiring more work to would be needed with a standard interbody fusion with complete medial facetectomies radical foraminotomies of the L3 and L4 nerve roots.  #3 posterior lumbar interbody fusion using the globus rise expandable cage system packed with locally harvested autograft mixed with DBX and BMP.  #4 pedicle screw fixation using the globus Creole amp pedicle screw set  #5 posterior lateral arthrodesis L3-4 utilizing the autograft mix BMP and locally harvested autograft.  Surgeon: Dominica Severin Shakeira Rhee  Asst.: Sherley Bounds  Anesthesia: Gen.  EBL: Minimal  History of present illness: Patient is an 82 year old female whose undergone previous L4-5 fusion back in 2001 did very well however over the last several weeks and months of progress worsening back and bilateral leg pain workup has shown spinal listhesis slip was severe stenosis at L3-4 above her fusion. Due to patient's failure conservative treatment imaging findings and progression of clinical syndrome I recommended reexpansion fusion we will hardware and posterior lumbar interbody fusion L3-4. I extensively went over the risks and benefits of the operation with the patient as well as perioperative course expectations of outcome and alternatives of surgery and she understood and agreed to proceed forward.  Operative procedure: Patient was brought into the or was induced under general anesthesia positioned prone on the [facility] table back was prepped and draped in routine sterile fashion her old incision was opened up and extended cephalad scar tissues dissected free and subperiosteal dissection  was carried out over the per previous fusion hardware was exposed. The fusion did appear to be solid so disconnected the nuts and removed the screws rods and connectors at L4-5. Then identified the spinous process of L3 were formed a complete central decompression was complete radical facetectomies and foraminotomies there was a dense amount of scar tissue freed all this up marked facet arthropathy causing severe hourglass compression of thecal sac at that level. After complete facetectomies and decompression both the L3 and L4 nerve roots bilaterally were widely decompressed. I think regular the epidural veins of disc spaces cleanout bilaterally using sequential distraction I had an 11 distractor placement significantly reduce the deformity cleaned out the disc removed and prepared the endplates inserted and [id number] expandable cage (approximate 6 turns then prepared the contralateral side and similar fashion as well as removed central disc. Packed the central and interspace with locally harvested autograft DBX mix and BMP. Inserted the contralateral cage and expanded a similar fashion. Then I placed 2 rods 2 screws at L3 under fluoroscopy all screws excellent purchase in place 6 5 x 45 mm modular Creole amp screws. Then in the halls with that resided a previous L4 screws are placed 6 5 x 50 screws. All screws excellent purchase I then aggressively decorticated the TPs lateral gutters packed the remainder the autograft mixed with BMP and DBX and posterior laterally assembled the heads advanced screws more AP and lateral fluoroscopy confirmed good position of the implants and then I connected the rod and an anchoring down. Explore the foramina to confirm patency Sprengel vancomycin powder overlaid Gelfoam and the dura and placed to large Hemovac drain. Then the wounds closed in layers with after Vicryl injected experell in the fascia and spinal vancomycin particular Dermabond  benzo and Steri-Strips and sterile dressing  applied patient recovered in stable condition. At the end of case all needle counts sponge counts were correct.

## 2016-04-09 NOTE — Transfer of Care (Signed)
Immediate Anesthesia Transfer of Care Note  Patient: Stacie Roman  Procedure(s) Performed: Procedure(s): LUMBAR THREE-FOUR POSTERIOR LUMBAR FUSION WITH INTERBODY AND HARDWARE REMOVAL  (N/A)  Patient Location: PACU  Anesthesia Type:General  Level of Consciousness: awake, alert , oriented and patient cooperative  Airway & Oxygen Therapy: Patient Spontanous Breathing and Patient connected to nasal cannula oxygen  Post-op Assessment: Report given to RN, Post -op Vital signs reviewed and stable, Patient moving all extremities and Patient moving all extremities X 4  Post vital signs: Reviewed and stable  Last Vitals:  Filed Vitals:   04/09/16 0659  BP: 193/53  Pulse: 68  Temp: 36.3 C  Resp: 18    Last Pain:  Filed Vitals:   04/09/16 0700  PainSc: 6       Patients Stated Pain Goal: 2 ([id number])  Complications: No apparent anesthesia complications

## 2016-04-09 NOTE — Progress Notes (Signed)
Pt arrived to unit. BP elevated. Pt takes several blood pressure medications daily. Will administer next dose. Oriented to room. Son at bedside

## 2016-04-09 NOTE — H&P (Signed)
Stacie Roman is an 82 y.o. female.   Chief Complaint: Back and bilateral leg pain right greater than left HPI: Patient is a 20-year-old female is a previous L4-5 fusion presents with progressive worsening back and bilateral leg pain worse in the right neurogenic claudication. Workup with CT scan showed the listhesis and severe stenosis at L3-4 above a previous L4-L5 fusion. Due to her progression of clinical syndrome failed conservative treatment imaging findings I recommended re-expression fusion we will hardware L4-5 and posterior lumbar interbody fusion L3-4. I've extensively gone over the risks and benefits of that operation with the patient as well as perioperative course expectations of outcome and alternatives of surgery and she understands and agrees to proceed forward.  Past Medical History  Diagnosis Date  . Presence of permanent cardiac pacemaker   . CHF (congestive heart failure) ([place])   . Stroke [facility]) 2010    had aphasia , treated at [facility]- all resolved   . Hypertension   . Asthma   . Encounter for TB tine test 1955    treated /w medicine due to + tine test  . Hypothyroidism   . GERD (gastroesophageal reflux disease)   . Arthritis     DDD, neck & low back   . Bleeding nose     tx with cauterized at time   . Cancer ([place]) 1990's    removed - from face, ? Pre CA  . Chronic kidney disease     stage 4- renal failure , stent in each kidney   . COPD (chronic obstructive pulmonary disease) [facility])     Past Surgical History  Procedure Laterality Date  . Renal stents   02/2015    followed by CHF  . Back surgery  2001    lumbar  . Eye surgery      IOL followed cataracats being removed.   Marland Kitchen Appendectomy    . Abdominal hysterectomy    . Insert / replace / remove pacemaker  06/2015    Medtronic     History reviewed. No pertinent family history. Social History:  reports that she quit smoking about 57 years ago. She does not have any smokeless tobacco history on file. She reports  that she does not drink alcohol or use illicit drugs.  Allergies:  Allergies  Allergen Reactions  . Hydralazine Other (See Comments) and Shortness Of Breath    Dizzy, lightheaded Knocks her out  . Mushroom Extract Complex Anaphylaxis, Shortness Of Breath and Swelling    Throat swelling  . Aspirin Other (See Comments)    Bleeding disorder  . Morphine And Related Nausea And Vomiting    Medications Prior to Admission  Medication Sig Dispense Refill  . acetaminophen (TYLENOL) 500 MG tablet Take 1,000 mg by mouth every morning.    Marland Kitchen aspirin EC 81 MG tablet Take 81 mg by mouth every morning.    . bisacodyl (DULCOLAX) 5 MG EC tablet Take 5-10 mg by mouth 2 (two) times daily. 5 mg every morning and 10 mg every evening or 15 mg in the evening if needed for moderate constipation    . Cholecalciferol (VITAMIN D3) 5000 units CAPS Take 5,000 Units by mouth every morning.    . cloNIDine (CATAPRES) 0.1 MG tablet Take 0.1 mg by mouth 2 (two) times daily.    Marland Kitchen docusate sodium (COLACE) 100 MG capsule Take 200 mg by mouth 2 (two) times daily.    . DULERA 200-5 MCG/ACT AERO 2 puffs 2 (two) times daily.    Marland Kitchen  isosorbide mononitrate (IMDUR) 30 MG 24 hr tablet Take 30 mg by mouth daily at 6 PM. Take with 60 mg tablet to = 90 mg daily    . isosorbide mononitrate (IMDUR) 60 MG 24 hr tablet Take 60 mg by mouth daily at 6 PM. Take with 30 mg tablet to = 90 mg daily    . labetalol (NORMODYNE) 200 MG tablet Take 400 mg by mouth 3 (three) times daily.    Marland Kitchen levothyroxine (SYNTHROID, LEVOTHROID) 50 MCG tablet Take 50 mcg by mouth daily before breakfast.     . loratadine (CLARITIN) 10 MG tablet Take 10 mg by mouth every morning.    . Misc Natural Products (OSTEO BI-FLEX ADV JOINT SHIELD) TABS Take 2 tablets by mouth every morning.    Marland Kitchen NIFEdipine (PROCARDIA XL/ADALAT-CC) 60 MG 24 hr tablet Take 60 mg by mouth 2 (two) times daily.    . Omega-3 Fatty Acids (SALMON OIL-1000 PO) Take 1,000 mg by mouth.    . oxybutynin  (DITROPAN-XL) 10 MG 24 hr tablet Take 10 mg by mouth daily at 6 PM.     . OXYGEN Inhale 2 L into the lungs continuous. 2 L through the night as needed during the day    . torsemide (DEMADEX) 20 MG tablet Take 40 mg by mouth every morning.     Marland Kitchen acyclovir (ZOVIRAX) 400 MG tablet Take 800 mg by mouth every 8 (eight) hours as needed (for canker sores).    . VENTOLIN HFA 108 (90 Base) MCG/ACT inhaler Inhale 2 puffs into the lungs every 6 (six) hours as needed for wheezing or shortness of breath.       No results found for this or any previous visit (from the past 48 hour(s)). No results found.  Review of Systems  Constitutional: Negative.   HENT: Negative.   Eyes: Negative.   Respiratory: Negative.   Cardiovascular: Negative.   Gastrointestinal: Negative.   Genitourinary: Negative.   Musculoskeletal: Positive for myalgias, back pain and joint pain.  Skin: Negative.   Neurological: Positive for tingling.  Psychiatric/Behavioral: Negative.     Blood pressure 193/53, pulse 68, temperature 97.4 F (36.3 C), temperature source Oral, resp. rate 18, SpO2 100 %. Physical Exam  Constitutional: She is oriented to person, place, and time. She appears well-developed and well-nourished.  HENT:  Head: Normocephalic.  Eyes: Pupils are equal, round, and reactive to light.  Neck: Normal range of motion.  Respiratory: Effort normal.  GI: Soft. Bowel sounds are normal.  Neurological: She is alert and oriented to person, place, and time. She has normal strength. GCS eye subscore is 4. GCS verbal subscore is 5. GCS motor subscore is 6.  The patient is awake and alert strength is 5 out of 5 in her iliopsoas, quads, hamstrings, gastrocs, into tibialis, and EHL on the left right EHL is weak at 4+ out of 5.  Skin: Skin is warm and dry.     Assessment/Plan 82 year old female with a listhesis at L3-4 severe stenosis. She presents for removal of hardware and posterior lumbar interbody fusion  L3-4  Stacie Roman P, MD 04/09/2016, 7:52 AM

## 2016-04-09 NOTE — Anesthesia Preprocedure Evaluation (Signed)
Anesthesia Evaluation  Patient identified by MRN, date of birth, ID band Patient awake    Reviewed: Allergy & Precautions, NPO status , Patient's Chart, lab work & pertinent test results  Airway Mallampati: II  TM Distance: >3 FB Neck ROM: Full    Dental  (+) Teeth Intact, Dental Advisory Given   Pulmonary former smoker,    breath sounds clear to auscultation       Cardiovascular hypertension,  Rhythm:Regular Rate:Normal     Neuro/Psych    GI/Hepatic   Endo/Other    Renal/GU      Musculoskeletal   Abdominal   Peds  Hematology   Anesthesia Other Findings   Reproductive/Obstetrics                             Anesthesia Physical Anesthesia Plan  ASA: III  Anesthesia Plan: General   Post-op Pain Management:    Induction: Intravenous  Airway Management Planned: Oral ETT  Additional Equipment:   Intra-op Plan:   Post-operative Plan: Extubation in OR  Informed Consent: I have reviewed the patients History and Physical, chart, labs and discussed the procedure including the risks, benefits and alternatives for the proposed anesthesia with the patient or authorized representative who has indicated his/her understanding and acceptance.   Dental advisory given  Plan Discussed with: Anesthesiologist  Anesthesia Plan Comments:         Anesthesia Quick Evaluation

## 2016-04-10 MED FILL — Sodium Chloride IV Soln 0.9%: INTRAVENOUS | Qty: 1000 | Status: AC

## 2016-04-10 NOTE — Evaluation (Signed)
Physical Therapy Evaluation Patient Details Name: Stacie Roman MRN: [id number] DOB: 1931/01/12 Today's Date: 04/10/2016   History of Present Illness  pt presents post L3-4 PLIF with hardware removal.  pt with hx of Pacemaker, CHF, CVA, HTN, CA, CKD, COPD, and back surgery.    Clinical Impression  Pt still awaiting back brace to be delivered, so assisted pt to sit at EOB without brace.  Pt and son indicate plan is for pt to D/C to home, however pt will need to be able to go up "half a flight" of stairs to level where her bed and bathroom are located.  Will continue to follow while on acute.      Follow Up Recommendations Home health PT;Supervision/Assistance - 24 hour    Equipment Recommendations  None recommended by PT    Recommendations for Other Services       Precautions / Restrictions Precautions Precautions: Fall;Back Precaution Booklet Issued: No Precaution Comments: Reviewed back precautions Required Braces or Orthoses: Spinal Brace Spinal Brace: Lumbar corset;Applied in sitting position Restrictions Weight Bearing Restrictions: No      Mobility  Bed Mobility Overal bed mobility: Needs Assistance Bed Mobility: Sidelying to Sit;Sit to Sidelying   Sidelying to sit: Max assist;HOB elevated     Sit to sidelying: Max assist General bed mobility comments: Even with HOB elevated pt needed max A and use of bed pad under hips to bring pt to EOB.    Transfers                    Ambulation/Gait                Stairs            Wheelchair Mobility    Modified Rankin (Stroke Patients Only)       Balance Overall balance assessment: Needs assistance Sitting-balance support: Bilateral upper extremity supported;Feet supported Sitting balance-Leahy Scale: Poor Sitting balance - Comments: pt leans to R and anteriorly.                                       Pertinent Vitals/Pain Pain Assessment: 0-10 Pain Score: 8  Pain Location:  Back Pain Descriptors / Indicators: Aching;Grimacing;Guarding Pain Intervention(s): Limited activity within patient's tolerance;Monitored during session;Repositioned;Patient requesting pain meds-RN notified    Home Living Family/patient expects to be discharged to:: Private residence Living Arrangements: Children Available Help at Discharge: Family;Available 24 hours/day Type of Home: House Home Access: Stairs to enter   [employer] of Steps: "couple" Home Layout: Two level;Bed/bath upstairs Home Equipment: Walker - 2 wheels;Walker - 4 wheels;Bedside commode;Shower seat;Wheelchair - manual;Hospital bed      Prior Function Level of Independence: Needs assistance   Gait / Transfers Assistance Needed: uses a RW and W/C for longer distances.  ADL's / Homemaking Assistance Needed: A with ADLs and family performs all homemaking tasks.        Hand Dominance        Extremity/Trunk Assessment   Upper Extremity Assessment: Defer to OT evaluation           Lower Extremity Assessment: Generalized weakness      Cervical / Trunk Assessment: Kyphotic  Communication   Communication: No difficulties  Cognition Arousal/Alertness: Awake/alert Behavior During Therapy: WFL for tasks assessed/performed Overall Cognitive Status: Within Functional Limits for tasks assessed  General Comments      Exercises        Assessment/Plan    PT Assessment Patient needs continued PT services  PT Diagnosis Difficulty walking;Acute pain;Generalized weakness   PT Problem List Decreased strength;Decreased activity tolerance;Decreased balance;Decreased mobility;Decreased knowledge of use of DME;Decreased knowledge of precautions;Obesity;Pain  PT Treatment Interventions DME instruction;Gait training;Stair training;Functional mobility training;Therapeutic activities;Therapeutic exercise;Balance training;Patient/family education   PT Goals (Current goals  can be found in the Care Plan section) Acute Rehab PT Goals Patient Stated Goal: Get home. PT Goal Formulation: With patient/family Time For Goal Achievement: 04/24/16 Potential to Achieve Goals: Fair    Frequency Min 5X/week   Barriers to discharge Inaccessible home environment      Co-evaluation               End of Session Equipment Utilized During Treatment: Oxygen Activity Tolerance: Patient limited by fatigue;Patient limited by pain Patient left: in bed;with call bell/phone within reach;with nursing/sitter in room;with family/visitor present Nurse Communication: Mobility status         Time: [id number] PT Time Calculation (min) (ACUTE ONLY): 34 min   Charges:   PT Evaluation $PT Eval Moderate Complexity: 1 Procedure PT Treatments $Therapeutic Activity: 8-22 mins   PT G CodesCatarina Hartshorn, [place] [id number] 04/10/2016, 4:31 PM

## 2016-04-10 NOTE — Progress Notes (Signed)
OT Cancellation Note  Patient Details Name: Stacie Roman MRN: [id number] DOB: 09-Dec-1930   Cancelled Treatment:    Reason Eval/Treat Not Completed: Other (comment) (Pt eating lunch, request therapy return later. ) Of note, this is second attempt to see pt, pt awaiting delivery of back brace. Will reattempt as schedule permits.  [name] 04/10/2016, 1:37 PM

## 2016-04-10 NOTE — Progress Notes (Signed)
Subjective: Patient reports Doing well no leg pain back pain manageable  Objective: Vital signs in last 24 hours: Temp:  [97.2 F (36.2 C)-98.3 F (36.8 C)] 98.3 F (36.8 C) (06/08 0400) Pulse Rate:  [58-74] 60 (06/08 0404) Resp:  [9-28] 16 (06/08 0128) BP: (132-220)/(43-106) 159/45 mmHg (06/08 0400) SpO2:  [92 %-100 %] 100 % (06/08 0404)  Intake/Output from previous day: 06/07 0701 - 06/08 0700 In: 1778 [P.O.:220; I.V.:1503; Blood:55] Out: [id number] [Urine:755; Drains:260; Blood:500] Intake/Output this shift:    Awake alert strength 5 out of 5 some trace hip flexor weakness that I think is somewhat effort-dependent pain limited  Lab Results: No results for input(s): WBC, HGB, HCT, PLT in the last 72 hours. BMET No results for input(s): NA, K, CL, CO2, GLUCOSE, BUN, CREATININE, CALCIUM in the last 72 hours.  Studies/Results: Dg Lumbar Spine 2-3 Views  04/09/2016  CLINICAL DATA:  Post L3-L4 fusion EXAM: LUMBAR SPINE - 2-3 VIEW; DG C-ARM 61-120 MIN COMPARISON:  Lumbar spine CT 10/11/2015 FINDINGS: Posterior transpedicular screws noted I assume at L3-L4 level. There is a disc spacer I assume L3 L4 level. The previous metallic screw at L5 level is not identified. There is anatomic alignment. Please note the lower lumbar spine is not visualized therefore numbering levels is very difficult. IMPRESSION: Posterior transpedicular screws noted I assume at L3-L4 level. There is a disc spacer I assume L3 L4 level. The previous metallic screw at L5 level is not identified. There is anatomic alignment. Please note the lower lumbar spine is not visualized therefore numbering levels is very difficult. Fluoroscopy time was 29 seconds.  Please see the operative report. These results were called by telephone at the time of interpretation on 04/09/2016 at 12:49 pm to Dr. Kary Kos , who verbally acknowledged these results. Electronically Signed   By: Lahoma Crocker M.D.   On: 04/09/2016 12:50   Dg C-arm 1-60  Min  04/09/2016  CLINICAL DATA:  Post L3-L4 fusion EXAM: LUMBAR SPINE - 2-3 VIEW; DG C-ARM 61-120 MIN COMPARISON:  Lumbar spine CT 10/11/2015 FINDINGS: Posterior transpedicular screws noted I assume at L3-L4 level. There is a disc spacer I assume L3 L4 level. The previous metallic screw at L5 level is not identified. There is anatomic alignment. Please note the lower lumbar spine is not visualized therefore numbering levels is very difficult. IMPRESSION: Posterior transpedicular screws noted I assume at L3-L4 level. There is a disc spacer I assume L3 L4 level. The previous metallic screw at L5 level is not identified. There is anatomic alignment. Please note the lower lumbar spine is not visualized therefore numbering levels is very difficult. Fluoroscopy time was 29 seconds.  Please see the operative report. These results were called by telephone at the time of interpretation on 04/09/2016 at 12:49 pm to Dr. Kary Kos , who verbally acknowledged these results. Electronically Signed   By: Lahoma Crocker M.D.   On: 04/09/2016 12:50    Assessment/Plan: Mobilized day with physical and occupational therapy incision clean dry and intact  LOS: 1 day     Stacie Roman P 04/10/2016, 7:28 AM

## 2016-04-10 NOTE — Care Management Note (Signed)
Case Management Note  Patient Details  Name: Stacie Roman MRN: [id number] Date of Birth: [birth year]  Subjective/Objective:   Pt admitted and underwent: LUMBAR THREE-FOUR POSTERIOR LUMBAR FUSION WITH INTERBODY AND HARDWARE REMOVAL. She is from home with family.               Action/Plan: Awaiting PT/OT recommendations. CM following for d/c disposition.   Expected Discharge Date:                  Expected Discharge Plan:     In-House Referral:     Discharge planning Services     Post Acute Care Choice:    Choice offered to:     DME Arranged:    DME Agency:     HH Arranged:    HH Agency:     Status of Service:  In process, will continue to follow  Medicare Important Message Given:    Date Medicare IM Given:    Medicare IM give by:    Date Additional Medicare IM Given:    Additional Medicare Important Message give by:     If discussed at [place] of Stay Meetings, dates discussed:    Additional Comments:  Pollie Friar, RN 04/10/2016, 10:52 AM

## 2016-04-11 NOTE — Progress Notes (Signed)
Physical Therapy Treatment Patient Details Name: Stacie Roman MRN: [id number] DOB: 11-27-1930 Today's Date: 04/11/2016    History of Present Illness pt presents post L3-4 PLIF with hardware removal.  pt with hx of Pacemaker, CHF, CVA, HTN, CA, CKD, COPD, and back surgery.      PT Comments    Patient presented with need for varying levels of assist this session from min A for rolling and total A for SPT. Pt very anxious and required max cues for relaxation and breathing technique throughout as she experienced increased HR and decreased SpO2 with transfer to [facility]. Pt required +3 assist to return to bed due to inability to assist in transfer midway [facility] to bed. O2 increased to 3L with mobility. RN present end of session. Due to pt's current mobility level, recommending SNF for further skilled PT services to maximize independence and safety with mobility before return home. Pt and son agreeable to updated d/c plan but concerned about insurance coverage.   Follow Up Recommendations  SNF;Supervision/Assistance - 24 hour     Equipment Recommendations  None recommended by PT    Recommendations for Other Services       Precautions / Restrictions Precautions Precautions: Fall;Back Precaution Comments: pt unable to recall precuations beginning of session; 2L home O2 Required Braces or Orthoses: Spinal Brace Spinal Brace: Lumbar corset;Applied in sitting position Restrictions Weight Bearing Restrictions: No    Mobility  Bed Mobility Overal bed mobility: Needs Assistance Bed Mobility: Rolling;Sidelying to Sit;Sit to Supine Rolling: Min assist;Mod assist;+2 for physical assistance (fluctuating from min A to mod +2) Sidelying to sit: Mod assist;+2 for physical assistance   Sit to supine: Total assist;+2 for physical assistance   General bed mobility comments: mod A +2 for initial rolling and sidelying to sit with assist to bring bilat LE to EOB, elevate trunk into sitting, and scoot hips to  EOB with use of bedpad; total A +2 to return to supine after transfer to [facility] and min A to roll R and L for repositioning in bed  Transfers Overall transfer level: Needs assistance Equipment used: 2 person hand held assist;Rolling walker (2 wheeled) Transfers: Sit to/from [facility] Sit to Stand: Mod assist;+2 physical assistance Stand pivot transfers: Mod assist;Total assist;+2 physical assistance (initial transfer mod +2, second transfer total +2. RW used.)       General transfer comment: EOB to BSC mod A +2 to power up into standing and pivot with pt holding onto therapist's arms and cues for sequencing and technique; mod A +2 to stand at [facility] with RW and pt stopped assisting mid transfer and required total A +2 to return to bed with max verbal and physical cues at trunk and LE and assit to pivot feet   Ambulation/Gait                 Stairs            Wheelchair Mobility    Modified Rankin (Stroke Patients Only)       Balance Overall balance assessment: Needs assistance Sitting-balance support: Bilateral upper extremity supported Sitting balance-Leahy Scale: Poor Sitting balance - Comments: Pt leans to R and anteriorly despite max verbal cues to correct.   Standing balance support: Bilateral upper extremity supported Standing balance-Leahy Scale: Poor (zero with second SPT ) Standing balance comment: max assist +2 for standing.                    Cognition Arousal/Alertness: Awake/alert Behavior  During Therapy: Anxious;Impulsive Overall Cognitive Status: Within Functional Limits for tasks assessed                      Exercises      General Comments General comments (skin integrity, edema, etc.): pt very anxious about mobility and required cues for relaxation/pursed lip breating and increased O2 to 3L vs 2L (RN aware) once on BSC after SpO2 dropped to 86-87%      Pertinent Vitals/Pain Pain Assessment: 0-10 Pain Score:  10-Worst pain ever Pain Location: back Pain Descriptors / Indicators: Aching;Grimacing Pain Intervention(s): Monitored during session;Premedicated before session;RN gave pain meds during session    Home Living Family/patient expects to be discharged to:: Private residence Living Arrangements: Children Available Help at Discharge: Family;Available 24 hours/day Type of Home: House Home Access: Stairs to enter   Home Layout: Multi-level;Bed/bath upstairs Home Equipment: Environmental consultant - 2 wheels;Walker - 4 wheels;Bedside commode;Shower seat;Wheelchair - manual;Hospital bed      Prior Function Level of Independence: Needs assistance  Gait / Transfers Assistance Needed: uses a RW and W/C for longer distances. ADL's / Homemaking Assistance Needed: Set up with  ADLs and family performs all homemaking tasks.     PT Goals (current goals can now be found in the care plan section) Acute Rehab PT Goals Patient Stated Goal: decrease pain    Frequency  Min 5X/week    PT Plan Discharge plan needs to be updated    Co-evaluation PT/OT/SLP Co-Evaluation/Treatment: Yes Reason for Co-Treatment: For patient/therapist safety PT goals addressed during session: Mobility/safety with mobility;Proper use of DME OT goals addressed during session: ADL's and self-care;Other (comment) (functional mobility)     End of Session Equipment Utilized During Treatment: Oxygen;Gait belt Activity Tolerance: Patient limited by fatigue;Patient limited by pain Patient left: in bed;with call bell/phone within reach;with bed alarm set;with family/visitor present     Time: [id number] PT Time Calculation (min) (ACUTE ONLY): 44 min  Charges:  $Therapeutic Activity: 23-37 mins                    G Codes:      Salina April, PTA Pager: [phone]   04/11/2016, 1:31 PM

## 2016-04-11 NOTE — NC FL2 (Signed)
[place] LEVEL OF CARE SCREENING TOOL     IDENTIFICATION  Patient Name: Stacie Roman Birthdate: 29-Oct-1931 Sex: female Admission Date (Current Location): 04/09/2016  [facility] [place] Number:  [employer] and Address:  [facility], [place] [address] [place], [place] [address]      Provider Number: [id number]  Attending Physician Name and Address:  Kary Kos, MD  Relative Name and Phone Number:  Charlotte Crumb, son, [phone]    Current Level of Care: Hospital Recommended Level of Care: [place] Prior Approval Number:    Date Approved/Denied:   PASRR Number: [id number]  Discharge Plan: SNF    Current Diagnoses: Patient Active Problem List   Diagnosis Date Noted  . Spondylolisthesis at L3-L4 level 04/09/2016    Orientation RESPIRATION BLADDER Height & Weight     Self, Time, Situation, Place  Normal Incontinent Weight:   Height:     BEHAVIORAL SYMPTOMS/MOOD NEUROLOGICAL BOWEL NUTRITION STATUS      Continent Diet (Please see DC Summary)  AMBULATORY STATUS COMMUNICATION OF NEEDS Skin   Extensive Assist Verbally Surgical wounds (Closed incision on back)                       Personal Care Assistance Level of Assistance  Bathing, Feeding, Dressing Bathing Assistance: Maximum assistance Feeding assistance: Independent Dressing Assistance: Limited assistance     Functional Limitations Info             SPECIAL CARE FACTORS FREQUENCY  PT (By licensed PT), OT (By licensed OT)     PT Frequency: min 4x/week OT Frequency: min 2x/week            Contractures      Additional Factors Info  Code Status, Allergies Code Status Info: Not on file Allergies Info: Hydralazine, Mushroom Extract Complex, Aspirin, Morphine And Related           Current Medications (04/11/2016):  This is the current hospital active medication list Current Facility-Administered Medications  Medication Dose Route  Frequency Provider Last Rate Last Dose  . 0.9 %  sodium chloride infusion  250 mL Intravenous Continuous Kary Kos, MD      . acetaminophen (TYLENOL) tablet 650 mg  650 mg Oral Q4H PRN Kary Kos, MD       Or  . acetaminophen (TYLENOL) suppository 650 mg  650 mg Rectal Q4H PRN Kary Kos, MD      . acetaminophen (TYLENOL) tablet 1,000 mg  1,000 mg Oral Myra Rude, MD   1,000 mg at 04/11/16 [id number]  . acyclovir (ZOVIRAX) tablet 800 mg  800 mg Oral Q8H PRN Kary Kos, MD      . albuterol (PROVENTIL) (2.5 MG/3ML) 0.083% nebulizer solution 2 mL  2 mL Inhalation Q6H PRN Kary Kos, MD      . aspirin EC tablet 81 mg  81 mg Oral Myra Rude, MD   81 mg at 04/11/16 [id number]  . bisacodyl (DULCOLAX) EC tablet 10 mg  10 mg Oral QHS Kary Kos, MD   10 mg at 04/10/16 2236  . bisacodyl (DULCOLAX) EC tablet 5 mg  5 mg Oral Daily Kary Kos, MD   5 mg at 04/11/16 0855  . bupivacaine liposome (EXPAREL) 1.3 % injection 266 mg  20 mL Infiltration Once Kary Kos, MD      . cholecalciferol (VITAMIN D) tablet 5,000 Units  5,000 Units Oral [id number] Kary Kos, MD   5,000  Units at 04/11/16 [id number]  . cloNIDine (CATAPRES) tablet 0.1 mg  0.1 mg Oral BID Kary Kos, MD   0.1 mg at 04/11/16 0856  . cyclobenzaprine (FLEXERIL) tablet 10 mg  10 mg Oral TID PRN Kary Kos, MD   10 mg at 04/11/16 [id number]  . docusate sodium (COLACE) capsule 200 mg  200 mg Oral BID Kary Kos, MD   200 mg at 04/11/16 0853  . HYDROmorphone (DILAUDID) injection 0.5-1 mg  0.5-1 mg Intravenous Q2H PRN Kary Kos, MD   1 mg at 04/11/16 0525  . isosorbide mononitrate (IMDUR) 24 hr tablet 90 mg  90 mg Oral q1800 Kary Kos, MD   90 mg at 04/10/16 1829  . labetalol (NORMODYNE) tablet 400 mg  400 mg Oral TID Kary Kos, MD   400 mg at 04/10/16 2237  . levothyroxine (SYNTHROID, LEVOTHROID) tablet 50 mcg  50 mcg Oral QAC breakfast Kary Kos, MD   50 mcg at 04/11/16 0856  . loratadine (CLARITIN) tablet 10 mg  10 mg Oral Myra Rude, MD   10 mg at 04/11/16 [id number]  .  menthol-cetylpyridinium (CEPACOL) lozenge 3 mg  1 lozenge Oral PRN Kary Kos, MD       Or  . phenol (CHLORASEPTIC) mouth spray 1 spray  1 spray Mouth/Throat PRN Kary Kos, MD      . mometasone-formoterol [facility]) 200-5 MCG/ACT inhaler 2 puff  2 puff Inhalation BID Kary Kos, MD   2 puff at 04/11/16 0904  . NIFEdipine (PROCARDIA-XL/ADALAT CC) 24 hr tablet 60 mg  60 mg Oral BID Kary Kos, MD   60 mg at 04/11/16 0857  . ondansetron (ZOFRAN) injection 4 mg  4 mg Intravenous Q4H PRN Kary Kos, MD      . oxybutynin (DITROPAN-XL) 24 hr tablet 10 mg  10 mg Oral q1800 Kary Kos, MD   10 mg at 04/10/16 1830  . oxyCODONE-acetaminophen (PERCOCET/ROXICET) 5-325 MG per tablet 1-2 tablet  1-2 tablet Oral Q4H PRN Kary Kos, MD   2 tablet at 04/11/16 0853  . senna-docusate (Senokot-S) tablet 1 tablet  1 tablet Oral QHS PRN Kary Kos, MD      . sodium chloride flush (NS) 0.9 % injection 3 mL  3 mL Intravenous Q12H Kary Kos, MD   3 mL at 04/10/16 0827  . sodium chloride flush (NS) 0.9 % injection 3 mL  3 mL Intravenous PRN Kary Kos, MD      . torsemide [facility]) tablet 40 mg  40 mg Oral BH-q7a Kary Kos, MD   40 mg at 04/11/16 [id number]     Discharge Medications: Please see discharge summary for a list of discharge medications.  Relevant Imaging Results:  Relevant Lab Results:   Additional Information SSN: [place], [place]

## 2016-04-11 NOTE — Evaluation (Signed)
Occupational Therapy Evaluation Patient Details Name: SHELIE KLITZKE MRN: [id number] DOB: 04-Feb-1931 Today's Date: 04/11/2016    History of Present Illness pt presents post L3-4 PLIF with hardware removal.  pt with hx of Pacemaker, CHF, CVA, HTN, CA, CKD, COPD, and back surgery.     Clinical Impression   Pt reports she was managing ADLs with setup PTA. Pt presenting with fluctuating levels of assist required throughout functional activities during session. With initial bed mobility, pt required mod assist +2; with rolling to reposition at end of session pt required min assist +1. With initial stand pivot transfer to [facility] pt required mod assist +2; with transfer back to bed pt with increased LE weakness mid transfer requiring total assist +2. Max verbal and tactile cues provided throughout session for sequencing, initiation, technique. SpO2 down to 86 with functional mobility on 2L O2; increased O2 to 3L and educated pt on breathing techniques- SpO2 returned to mid 90s. HR up to 140 (unsure of accuracy); RN present in room. Suspect pt is very anxious, especially with mobility, which is contributing to her performance with therapy today. Feel pt is not safe to return directly home at this time; recommending SNF for further rehab to maximize independence and safety with ADLs and functional mobility. Discussed this with pt and son; they are agreeable but concerned insurance wont cover SNF stay (CM aware and will notify SW). Pt would benefit from continued skilled OT to address established goals.    Follow Up Recommendations  SNF;Supervision/Assistance - 24 hour    Equipment Recommendations  Other (comment) (TBD)    Recommendations for Other Services       Precautions / Restrictions Precautions Precautions: Fall;Back Precaution Comments: Educated pt on back precautions; unable to recall any precautions at start of session. Pt on 2L home O2.  Required Braces or Orthoses: Spinal Brace Spinal  Brace: Lumbar corset;Applied in sitting position Restrictions Weight Bearing Restrictions: No      Mobility Bed Mobility Overal bed mobility: Needs Assistance Bed Mobility: Rolling;Sidelying to Sit;Sit to Supine Rolling: Min assist;Mod assist;+2 for physical assistance (fluctuating from min A to mod +2) Sidelying to sit: Mod assist;+2 for physical assistance   Sit to supine: Total assist;+2 for physical assistance   General bed mobility comments: Pt demonstrating fluctuating levels of assist required for bed mobility. At end of session pt able to roll in bed with min assist for repositioning but required mod-total +2 throughout session. Verbal and tactile cues given throuhgout for technique, safety, and back precautions.  Transfers Overall transfer level: Needs assistance Equipment used: 2 person hand held assist;Rolling walker (2 wheeled) Transfers: Sit to/from [facility] Sit to Stand: Mod assist;+2 physical assistance Stand pivot transfers: Mod assist;Total assist;+2 physical assistance (initial transfer mod +2, second transfer total +2. RW used.)       General transfer comment: With initial transfer from EOB to [facility] pt required mod assist +2 for sit to stand and for stand pivot. With return transfer from [facility] to bed pt was able to stand with mod assist +2; then mid transfer pts legs seemed to give out; required total assist +2 for return to bed despite max verbal and tactile cues.    Balance Overall balance assessment: Needs assistance Sitting-balance support: Bilateral upper extremity supported;Feet supported Sitting balance-Leahy Scale: Poor Sitting balance - Comments: Pt leans to R and anteriorly despite max verbal cues to correct.   Standing balance support: Bilateral upper extremity supported;During functional activity Standing balance-Leahy Scale: Poor Standing  balance comment: max assist +2 for standing.                            ADL Overall  ADL's : Needs assistance/impaired   Eating/Feeding Details (indicate cue type and reason): Pt reports difficulty swallowing pain meds with water and applesauce; RN present and aware. Pt reports she had swalling difficulty PTA.                 Lower Body Dressing: Total assistance;+2 for physical assistance;Sit to/from stand   Toilet Transfer: Moderate assistance;Total assistance;+2 for physical assistance;Stand-pivot;Cueing for safety;Cueing for sequencing;BSC Toilet Transfer Details (indicate cue type and reason): Mod assist +2 for initial transfer from EOB to [facility]. Pt required total assist +2 for return stand pivot to transfer. Pt assisting with sit to stand from [facility] initially; then mid-transfer pt not moving legs or standing up straight despite max verbal and tactile cues.   Toileting- Clothing Manipulation and Hygiene: Total assistance;+2 for physical assistance;Sit to/from stand Toileting - Clothing Manipulation Details (indicate cue type and reason): Total assist for doffing/donning brief and socks.       General ADL Comments: SpO2 down to 86 with mobility on 2L O2 via nasal canula; increased O2 to 3L and SpO2 to mid 90s. HR up to 140 with RN present in room. Suspect pt experiencing high anxiety with mobility. Throuhgout session; pt with fluctuating levels of assist. Continually states "I cant" when asked to do something but with verbal encouragement can complete task. Discussed with pt and son potential need for SNF placement for further rehab prior to return home; both apprehensive but pt agreeable at end of session. Concerned about insurance not covering SNF stay.     Vision     Perception     Praxis      Pertinent Vitals/Pain Pain Assessment: 0-10 Pain Score: 10-Worst pain ever Pain Location: back Pain Descriptors / Indicators: Aching;Grimacing Pain Intervention(s): Monitored during session;Premedicated before session;RN gave pain meds during session     Hand Dominance      Extremity/Trunk Assessment Upper Extremity Assessment Upper Extremity Assessment: Generalized weakness   Lower Extremity Assessment Lower Extremity Assessment: Defer to PT evaluation   Cervical / Trunk Assessment Cervical / Trunk Assessment: Kyphotic   Communication Communication Communication: No difficulties   Cognition Arousal/Alertness: Awake/alert Behavior During Therapy: Anxious;Impulsive Overall Cognitive Status: Within Functional Limits for tasks assessed                     General Comments       Exercises       Shoulder Instructions      Home Living Family/patient expects to be discharged to:: Private residence Living Arrangements: Children Available Help at Discharge: Family;Available 24 hours/day Type of Home: House Home Access: Stairs to enter [employer] of Steps: "couple"   Home Layout: Multi-level;Bed/bath upstairs     Bathroom Shower/Tub: Occupational psychologist: Standard     Home Equipment: Environmental consultant - 2 wheels;Walker - 4 wheels;Bedside commode;Shower seat;Wheelchair - manual;Hospital bed          Prior Functioning/Environment Level of Independence: Needs assistance  Gait / Transfers Assistance Needed: uses a RW and W/C for longer distances. ADL's / Homemaking Assistance Needed: Set up with  ADLs and family performs all homemaking tasks.        OT Diagnosis: Generalized weakness;Acute pain   OT Problem List: Decreased strength;Decreased range of motion;Decreased activity  tolerance;Impaired balance (sitting and/or standing);Decreased cognition;Decreased safety awareness;Decreased knowledge of use of DME or AE;Decreased knowledge of precautions;Obesity;Pain   OT Treatment/Interventions: Self-care/ADL training;Therapeutic exercise;Energy conservation;DME and/or AE instruction;Therapeutic activities;Patient/family education;Balance training    OT Goals(Current goals can be found in the care plan section) Acute  Rehab OT Goals Patient Stated Goal: decrease pain OT Goal Formulation: With patient Time For Goal Achievement: 04/25/16 Potential to Achieve Goals: Fair ADL Goals Pt Will Perform Grooming: with min assist;standing Pt Will Perform Upper Body Bathing: with min guard assist;sitting Pt Will Perform Lower Body Bathing: with min assist;with adaptive equipment;sit to/from stand Pt Will Transfer to Toilet: with min assist;stand pivot transfer;bedside commode Pt Will Perform Toileting - Clothing Manipulation and hygiene: with min assist;with adaptive equipment;sit to/from stand  OT Frequency: Min 2X/week   Barriers to D/C: Inaccessible home environment  Bed/bath on second level of home       Co-evaluation PT/OT/SLP Co-Evaluation/Treatment: Yes Reason for Co-Treatment: For patient/therapist safety   OT goals addressed during session: ADL's and self-care;Other (comment) (functional mobility)      End of Session Equipment Utilized During Treatment: Gait belt;Rolling walker;Oxygen Nurse Communication: Mobility status;Other (comment) (vitals-RN present)  Activity Tolerance: Patient limited by pain;Patient limited by fatigue;Other (comment) (?anxiety) Patient left: in bed;with call bell/phone within reach;with bed alarm set;with family/visitor present   Time: [id number] OT Time Calculation (min): 44 min Charges:  OT General Charges $OT Visit: 1 Procedure OT Evaluation $OT Eval Moderate Complexity: 1 Procedure OT Treatments $Self Care/Home Management : 8-22 mins G-Codes:     Binnie Kand M.S., OTR/L Pager: [phone]  04/11/2016, 10:21 AM

## 2016-04-11 NOTE — Care Management Important Message (Signed)
Important Message  Patient Details  Name: MARIETTE SLIMAN MRN: [id number] Date of Birth: 1931-01-11   Medicare Important Message Given:  Yes    Skylen Danielsen Abena 04/11/2016, 11:04 AM

## 2016-04-11 NOTE — Progress Notes (Signed)
No brace in patients room, patient states she does not have brace at home or in belongings. [employer] paged

## 2016-04-11 NOTE — Progress Notes (Signed)
CSW spoke with patient and her son at bedside. They would prefer to return home at discharge with [facility]. Patient's daughters also coming to help at home.  CSW signing off.  Percell Locus Sevilla Murtagh LCSWA [phone]

## 2016-04-12 ENCOUNTER — Inpatient Hospital Stay (HOSPITAL_COMMUNITY): Payer: Medicare HMO

## 2016-04-12 NOTE — Progress Notes (Signed)
Pt continued to retain urine; Bladder scanned for 337ml this am; pt c/o abd pain which was distended and firm to touch. Bowel sounds positive x4; MD notified at bedside and new orders received. Pt foley insert at 1120; peri-care completed prior foley insertion per protocol. Will continue to monitor pt. Delia Heady RN

## 2016-04-12 NOTE — Progress Notes (Signed)
Physical Therapy Treatment Patient Details Name: Stacie Roman MRN: [id number] DOB: 05/14/1931 Today's Date: 04/12/2016    History of Present Illness pt presents post L3-4 PLIF with hardware removal.  pt with hx of Pacemaker, CHF, CVA, HTN, CA, CKD, COPD, and back surgery.      PT Comments    Client not progressing with mobility at this date.  Requires max assist for bed mobility & mod assist +2 for transfers.  Spoke with patient about d/c recommendations and at this time this clinician feels patient would best benefit from SNF at d/c if she does not progress. Patient states she's agreeable if needed.     Follow Up Recommendations  SNF;Supervision/Assistance - 24 hour     Equipment Recommendations       Recommendations for Other Services       Precautions / Restrictions Precautions Precautions: Fall;Back Precaution Comments: pt unable to recall precuations beginning of session; 2L home O2 Required Braces or Orthoses: Spinal Brace Spinal Brace: Lumbar corset;Applied in sitting position Restrictions Weight Bearing Restrictions: No    Mobility  Bed Mobility Overal bed mobility: Needs Assistance Bed Mobility: Rolling Rolling: Max assist Sidelying to sit: Max assist       General bed mobility comments: cues for sequencing. (A) for all components of transitional movement & use of bed pad to bring hips closer to EOB.   Transfers Overall transfer level: Needs assistance Equipment used: 2 person hand held assist Transfers: Stand Pivot Transfers Sit to Stand: Mod assist;+2 physical assistance Stand pivot transfers: +2 physical assistance;Mod assist       General transfer comment: (A) to power into standing, maintain balance, support trunk over weak LE's, and blocking of bil knees due to Lt knee buckling during stand pivot.    Ambulation/Gait                 Stairs            Wheelchair Mobility    Modified Rankin (Stroke Patients Only)        Balance Overall balance assessment: Needs assistance Sitting-balance support: Single extremity supported;Feet supported Sitting balance-Leahy Scale: Poor Sitting balance - Comments: Pt leans to R and anteriorly despite max verbal cues to correct.                            Cognition Arousal/Alertness: Awake/alert Behavior During Therapy: Anxious;Impulsive Overall Cognitive Status: Within Functional Limits for tasks assessed                      Exercises      General Comments General comments (skin integrity, edema, etc.): Pt continues to exhibit anxiety with mobility.  Pt on 2L 02 entire session.  Positioned in recliner at end of chair with chair alarm in placwe      Pertinent Vitals/Pain Pain Assessment: 0-10 Pain Score: 10-Worst pain ever Pain Location: back Pain Descriptors / Indicators: Aching;Guarding;Grimacing Pain Intervention(s): Limited activity within patient's tolerance;Monitored during session;Repositioned;Patient requesting pain meds-RN notified;Other (comment) (notified RN of need for pain medication)    Home Living                      Prior Function            PT Goals (current goals can now be found in the care plan section) Acute Rehab PT Goals Patient Stated Goal: decrease pain PT Goal Formulation: With patient Time For  Goal Achievement: 04/24/16 Potential to Achieve Goals: Fair Progress towards PT goals: Not progressing toward goals - comment (due to pain)    Frequency  Min 5X/week    PT Plan      Co-evaluation PT/OT/SLP Co-Evaluation/Treatment: Yes Reason for Co-Treatment: For patient/therapist safety PT goals addressed during session: Mobility/safety with mobility       End of Session Equipment Utilized During Treatment: Oxygen;Gait belt Activity Tolerance: Patient limited by pain Patient left: in chair;with call bell/phone within reach;with chair alarm set     Time: [id number] PT Time Calculation (min)  (ACUTE ONLY): 28 min  Charges:  $Therapeutic Activity: 23-37 mins                    G Codes:      Sena Hitch 04/12/2016, 9:34 AM   Sarajane Marek, PTA [phone] 04/12/2016

## 2016-04-12 NOTE — Progress Notes (Signed)
Patient ID: Stacie Roman, female   DOB: 13-Mar-1931, 82 y.o.   MRN: [id number] C/o abdominal pain, no flatus. Unable to void  See orders

## 2016-04-12 NOTE — Progress Notes (Signed)
Occupational Therapy Treatment Patient Details Name: Stacie Roman MRN: [id number] DOB: 09-Apr-1931 Today's Date: 04/12/2016    History of present illness pt presents post L3-4 PLIF with hardware removal.  pt with hx of Pacemaker, CHF, CVA, HTN, CA, CKD, COPD, and back surgery.     OT comments  Pt continues to require mod assist +2 for stand pivot transfers. Seems to be anxious about all movement requiring max verbal cues for encouragement. Strongly feel that pt is still unsafe to d/c home with the current level of assist required for functional mobility and ADLs. Again, discussed SNF placement with pt and she is agreeable and understands that she cannot go home in her current state. D/c plan remains appropriate. Will continue to follow acutely.   Follow Up Recommendations  SNF;Supervision/Assistance - 24 hour    Equipment Recommendations  Other (comment) (TBD)    Recommendations for Other Services      Precautions / Restrictions Precautions Precautions: Fall;Back Precaution Comments: pt unable to recall precuations beginning of session; 2L home O2 Required Braces or Orthoses: Spinal Brace Spinal Brace: Lumbar corset;Applied in sitting position Restrictions Weight Bearing Restrictions: No       Mobility Bed Mobility Overal bed mobility: Needs Assistance Bed Mobility: Rolling Rolling: Max assist Sidelying to sit: Max assist       General bed mobility comments: cues for sequencing. (A) for all components of transitional movement & use of bed pad to bring hips closer to EOB.   Transfers Overall transfer level: Needs assistance Equipment used: 2 person hand held assist Transfers: Sit to/from [facility] Sit to Stand: Mod assist;+2 physical assistance Stand pivot transfers: +2 physical assistance;Mod assist       General transfer comment: (A) to power into standing, maintain balance, support trunk over weak LE's, and blocking of bil knees due to Lt knee  buckling during stand pivot.      Balance Overall balance assessment: Needs assistance Sitting-balance support: Single extremity supported;Feet supported Sitting balance-Leahy Scale: Poor Sitting balance - Comments: Pt leans to R and anteriorly despite max verbal cues to correct.   Standing balance support: Bilateral upper extremity supported Standing balance-Leahy Scale: Poor                     ADL Overall ADL's : Needs assistance/impaired                 Upper Body Dressing : Maximal assistance;Sitting Upper Body Dressing Details (indicate cue type and reason): to don back brace     Toilet Transfer: Moderate assistance;+2 for physical assistance;Stand-pivot;BSC Toilet Transfer Details (indicate cue type and reason): Simulated by transfer from EOB to chair.         Functional mobility during ADLs: Moderate assistance;+2 for physical assistance (for stand pivot only) General ADL Comments: Pt continues to seem very anxious with movement stating multiple times throughout session that she cant perform tasks. Again, discussed SNF placement with pt she is agreeable and understands that she cannot go home with the level of assist she currently requires.      Vision                     Perception     Praxis      Cognition   Behavior During Therapy: Anxious;Impulsive Overall Cognitive Status: Within Functional Limits for tasks assessed  Extremity/Trunk Assessment               Exercises     Shoulder Instructions       General Comments      Pertinent Vitals/ Pain       Pain Assessment: 0-10 Pain Score: 10-Worst pain ever Pain Location: back Pain Descriptors / Indicators: Aching;Grimacing;Guarding Pain Intervention(s): Limited activity within patient's tolerance;Monitored during session;Repositioned;Patient requesting pain meds-RN notified;Relaxation  Home Living                                           Prior Functioning/Environment              Frequency Min 2X/week     Progress Toward Goals  OT Goals(current goals can now be found in the care plan section)  Progress towards OT goals: Progressing toward goals  Acute Rehab OT Goals Patient Stated Goal: decrease pain OT Goal Formulation: With patient  Plan Discharge plan remains appropriate    Co-evaluation    PT/OT/SLP Co-Evaluation/Treatment: Yes Reason for Co-Treatment: For patient/therapist safety PT goals addressed during session: Mobility/safety with mobility OT goals addressed during session: Other (comment) (functional mobility)      End of Session Equipment Utilized During Treatment: Gait belt;Oxygen;Back brace   Activity Tolerance Patient limited by pain;Other (comment) (?anxiety)   Patient Left in chair;with call bell/phone within reach;with chair alarm set   Nurse Communication Patient requests pain meds;Other (comment) (pt up in chair)        Time: [id number] OT Time Calculation (min): 28 min  Charges: OT General Charges $OT Visit: 1 Procedure OT Treatments $Therapeutic Activity: 8-22 mins  Binnie Kand M.S., OTR/L Pager: [phone]  04/12/2016, 10:26 AM

## 2016-04-13 ENCOUNTER — Inpatient Hospital Stay (HOSPITAL_COMMUNITY): Payer: Medicare HMO

## 2016-04-13 MED ORDER — BISACODYL 10 MG RE SUPP
10.0000 mg | Freq: Every day | RECTAL | Status: DC | PRN
  Administered 2016-04-13 – 2016-04-19 (×4): 10 mg via RECTAL
  Filled 2016-04-13 (×4): qty 1

## 2016-04-13 MED ORDER — POLYETHYLENE GLYCOL 3350 17 G PO PACK
17.0000 g | PACK | Freq: Every day | ORAL | Status: DC
  Administered 2016-04-13 – 2016-04-19 (×7): 17 g via ORAL
  Filled 2016-04-13 (×8): qty 1

## 2016-04-13 MED ORDER — FLEET ENEMA 7-19 GM/118ML RE ENEM
1.0000 | ENEMA | Freq: Once | RECTAL | Status: AC
  Administered 2016-04-13: 1 via RECTAL
  Filled 2016-04-13: qty 1

## 2016-04-13 NOTE — Progress Notes (Signed)
Patient c/o nausea presently; patient has received oral laxatives today followed by suppository; with no results; mobility is greatly reduced; out of bed to chair today; transfer back to bed; no ambulation.  Medicated for nausea; continue to monitor for c/o gas.

## 2016-04-13 NOTE — Progress Notes (Addendum)
Physical Therapy Treatment Patient Details Name: Stacie Roman MRN: [id number] DOB: 11-06-1930 Today's Date: 04/13/2016    History of Present Illness pt presents post L3-4 PLIF with hardware removal.  pt with hx of Pacemaker, CHF, CVA, HTN, CA, CKD, COPD, and back surgery.      PT Comments    Pt continues to present with anxiety regarding mobility. Upon entering room, pt stating "Everyone keeps saying that I'm confused, but I'm not. I wish I could just get one person to believe me." Confusion suspected to be due to narcotics.  Follow Up Recommendations  SNF;Supervision/Assistance - 24 hour     Equipment Recommendations  None recommended by PT    Recommendations for Other Services       Precautions / Restrictions Precautions Precautions: Fall;Back Precaution Comments: pt unable to recall precuations. Reviewed 3/3 precautions. Required Braces or Orthoses: Spinal Brace Spinal Brace: Lumbar corset;Applied in sitting position    Mobility  Bed Mobility     Rolling: Max assist Sidelying to sit: Max assist;+2 for physical assistance       General bed mobility comments: Verbal cues for logroll. Use of bed pad to scoot to EOB.  Transfers   Equipment used: 2 person hand held assist   Sit to Stand: Mod assist;+2 physical assistance Stand pivot transfers: Mod assist;+2 physical assistance       General transfer comment: Attempted use of RW during transfer; however, pt retropulsive and unable to pivot her feet. RW removed and pt able to SPT with 2 person HHA.  Ambulation/Gait                 Stairs            Wheelchair Mobility    Modified Rankin (Stroke Patients Only)       Balance   Sitting-balance support: Single extremity supported;Feet supported Sitting balance-Leahy Scale: Poor     Standing balance support: Bilateral upper extremity supported;During functional activity Standing balance-Leahy Scale: Poor                       Cognition Arousal/Alertness: Awake/alert Behavior During Therapy: Anxious;Impulsive Overall Cognitive Status: Impaired/Different from baseline Area of Impairment: Memory;Safety/judgement;Problem solving     Memory: Decreased recall of precautions;Decreased short-term memory   Safety/Judgement: Decreased awareness of safety   Problem Solving: Slow processing;Difficulty sequencing;Decreased initiation;Requires verbal cues      Exercises      General Comments        Pertinent Vitals/Pain Pain Assessment: Faces Faces Pain Scale: Hurts even more Pain Location: back during mobility Pain Descriptors / Indicators: Grimacing;Guarding;Moaning Pain Intervention(s): Limited activity within patient's tolerance;Monitored during session;Repositioned;Relaxation    Home Living                      Prior Function            PT Goals (current goals can now be found in the care plan section) Acute Rehab PT Goals Patient Stated Goal: home PT Goal Formulation: With patient Time For Goal Achievement: 04/24/16 Potential to Achieve Goals: Fair Progress towards PT goals: Progressing toward goals (slowly)    Frequency  Min 5X/week    PT Plan Current plan remains appropriate    Co-evaluation             End of Session Equipment Utilized During Treatment: Back brace;Gait belt;Oxygen Activity Tolerance: Other (comment) (anxiety and confusion) Patient left: in chair;with call bell/phone within reach;with chair alarm  set;with nursing/sitter in room     Time: 0900-0918 PT Time Calculation (min) (ACUTE ONLY): 18 min  Charges:  $Therapeutic Activity: 8-22 mins                    G Codes:      Stacie Roman 04/13/2016, 11:52 AM

## 2016-04-13 NOTE — Progress Notes (Signed)
Patient ID: Stacie Roman, female   DOB: 11/30/1930, 82 y.o.   MRN: [id number] BP 117/77 mmHg  Pulse 60  Temp(Src) 97.9 F (36.6 C) (Oral)  Resp 18  Ht 5\' 2"  (1.575 m)  Wt 94.892 kg (209 lb 3.2 oz)  BMI 38.25 kg/m2  SpO2 100% Alert, following all commands Moving extremities, right hip flexors, extensors quads, hamstrings weak. Will perform CT to ensure hardware placement. Wound is clean, And dry Hold narcotics

## 2016-04-14 ENCOUNTER — Inpatient Hospital Stay (HOSPITAL_COMMUNITY): Payer: Medicare HMO

## 2016-04-14 ENCOUNTER — Encounter (HOSPITAL_COMMUNITY): Payer: Self-pay | Admitting: Family Medicine

## 2016-04-14 DIAGNOSIS — I5030 Unspecified diastolic (congestive) heart failure: Secondary | ICD-10-CM

## 2016-04-14 DIAGNOSIS — D649 Anemia, unspecified: Secondary | ICD-10-CM

## 2016-04-14 DIAGNOSIS — N184 Chronic kidney disease, stage 4 (severe): Secondary | ICD-10-CM

## 2016-04-14 DIAGNOSIS — N179 Acute kidney failure, unspecified: Secondary | ICD-10-CM

## 2016-04-14 DIAGNOSIS — R531 Weakness: Secondary | ICD-10-CM | POA: Insufficient documentation

## 2016-04-14 DIAGNOSIS — I509 Heart failure, unspecified: Secondary | ICD-10-CM

## 2016-04-14 LAB — CBC
HEMATOCRIT: 18.1 % — AB (ref 36.0–46.0)
HEMOGLOBIN: 5.9 g/dL — AB (ref 12.0–15.0)
MCH: 30.9 pg (ref 26.0–34.0)
MCHC: 32.6 g/dL (ref 30.0–36.0)
MCV: 94.8 fL (ref 78.0–100.0)
Platelets: 239 10*3/uL (ref 150–400)
RBC: 1.91 MIL/uL — ABNORMAL LOW (ref 3.87–5.11)
RDW: 13.1 % (ref 11.5–15.5)
WBC: 12.3 10*3/uL — AB (ref 4.0–10.5)

## 2016-04-14 LAB — PREPARE RBC (CROSSMATCH)

## 2016-04-14 LAB — ECHOCARDIOGRAM COMPLETE
CHL CUP MV DEC (S): 190
CHL CUP TV REG PEAK VELOCITY: 309 cm/s
EWDT: 190 ms
FS: 28 % (ref 28–44)
HEIGHTINCHES: 62 in
IV/PV OW: 0.85
LA ID, A-P, ES: 46 mm
LA diam end sys: 46 mm
LA vol A4C: 67.3 ml
LADIAMINDEX: 2.36 cm/m2
LV TDI E'LATERAL: 6.32
LV TDI E'MEDIAL: 3.64
LVELAT: 6.32 cm/s
LVOT area: 2.84 cm2
LVOTD: 19 mm
MV pk E vel: 1.5 m/s
PW: 13 mm — AB (ref 0.6–1.1)
TAPSE: 19.9 mm
TR max vel: 309 cm/s
WEIGHTICAEL: 3347.2 [oz_av]

## 2016-04-14 LAB — COMPREHENSIVE METABOLIC PANEL
ALT: <5 U/L — ABNORMAL LOW (ref 14–54)
ANION GAP: 13 (ref 5–15)
AST: 12 U/L — ABNORMAL LOW (ref 15–41)
Albumin: 2 g/dL — ABNORMAL LOW (ref 3.5–5.0)
Alkaline Phosphatase: 73 U/L (ref 38–126)
BILIRUBIN TOTAL: 0.5 mg/dL (ref 0.3–1.2)
BUN: 61 mg/dL — ABNORMAL HIGH (ref 6–20)
CALCIUM: 8 mg/dL — AB (ref 8.9–10.3)
CO2: 20 mmol/L — ABNORMAL LOW (ref 22–32)
Chloride: 98 mmol/L — ABNORMAL LOW (ref 101–111)
Creatinine, Ser: 4.36 mg/dL — ABNORMAL HIGH (ref 0.44–1.00)
GFR calc Af Amer: 10 mL/min — ABNORMAL LOW (ref >60)
GFR, EST NON AFRICAN AMERICAN: 9 mL/min — AB (ref >60)
Glucose, Bld: 171 mg/dL — ABNORMAL HIGH (ref 65–99)
POTASSIUM: 3.7 mmol/L (ref 3.5–5.1)
Sodium: 131 mmol/L — ABNORMAL LOW (ref 135–145)
TOTAL PROTEIN: 5.1 g/dL — AB (ref 6.5–8.1)

## 2016-04-14 MED ORDER — SODIUM CHLORIDE 0.9 % IV SOLN
Freq: Once | INTRAVENOUS | Status: AC
  Administered 2016-04-14: 12:00:00 via INTRAVENOUS

## 2016-04-14 MED ORDER — SODIUM CHLORIDE 0.9 % IV SOLN
250.0000 mL | INTRAVENOUS | Status: DC
  Administered 2016-04-14: 250 mL via INTRAVENOUS

## 2016-04-14 MED ORDER — TORSEMIDE 20 MG PO TABS
20.0000 mg | ORAL_TABLET | ORAL | Status: DC
  Administered 2016-04-15: 20 mg via ORAL
  Filled 2016-04-14: qty 1

## 2016-04-14 NOTE — Progress Notes (Signed)
CRITICAL VALUE ALERT  Critical value received:  HGB 5.9  Date of notification:  04/14/16  Time of notification:  [id number]  Critical value read back:  Yes  Nurse who received alert:  [facility]  MD notified (1st page):  Dr Saintclair Halsted in room with patient  Time of first page:  1039  MD notified (2nd page):  Time of second page:  Responding MD:  Saintclair Halsted  Time MD responded:  1039

## 2016-04-14 NOTE — Care Management Important Message (Signed)
Important Message  Patient Details  Name: Stacie Roman MRN: [id number] Date of Birth: 05-25-1931   Medicare Important Message Given:  Yes    Loann Quill 04/14/2016, 1:52 PM

## 2016-04-14 NOTE — Progress Notes (Signed)
Subjective: Patient reports Generally weak and increased low back pain as well as the right leg numbness and weakness.  Objective: Vital signs in last 24 hours: Temp:  [97.9 F (36.6 C)-98.9 F (37.2 C)] 97.9 F (36.6 C) (06/12 0555) Pulse Rate:  [59-69] 69 (06/12 0555) Resp:  [16-22] 16 (06/12 0857) BP: (130-163)/(34-64) 130/36 mmHg (06/12 0857) SpO2:  [96 %-100 %] 96 % (06/12 0857)  Intake/Output from previous day: 06/11 0701 - 06/12 0700 In: 240 [P.O.:240] Out: 950 [Urine:950] Intake/Output this shift:    Patient is lethargic but arousable difficult to get her to comply with exam however does appear to have generalized weakness she is 4-4+ strength in the left looks to me right looks to me is more 4 minus to 3+ a do think she has antigravity quad strength barely antigravity hip flexor strength.  Lab Results:  Recent Labs  04/14/16 0941  WBC 12.3*  HGB 5.9*  HCT 18.1*  PLT 239   BMET  Recent Labs  04/14/16 0941  NA 131*  K 3.7  CL 98*  CO2 20*  GLUCOSE 171*  BUN 61*  CREATININE 4.36*  CALCIUM 8.0*    Studies/Results: Ct Lumbar Spine Wo Contrast  04/13/2016  ADDENDUM REPORT: 04/13/2016 13:32 ADDENDUM: The original report was by Dr. Van Clines. The following addendum is by Dr. Van Clines: These results were called by telephone at the time of interpretation on 04/13/2016 at 1:28 pm to Dr. Ashok Pall , who verbally acknowledged these results. Electronically Signed   By: Van Clines M.D.   On: 04/13/2016 13:32  04/13/2016  CLINICAL DATA:  Hardware placement. Weakness. Back pain common discomfort. Recent L3-4 fusion 04/09/2016 EXAM: CT LUMBAR SPINE WITHOUT CONTRAST TECHNIQUE: Multidetector CT imaging of the lumbar spine was performed without intravenous contrast administration. Multiplanar CT image reconstructions were also generated. COMPARISON:  Multiple exams, including 04/09/2016 and 10/11/2015 FINDINGS: Partially sacralized L5 vertebra noted.  This numbering is consistent with the recent prior CT of 10/11/2015. Compared to 10/11/2015, the L4-5 posterolateral rod and pedicle screw fixation has been removed and there has been placement of posterolateral rod and pedicle screw fixators at L3-4. Interbody spacer noted at L3-4. There was previously 6 mm anterolisthesis at L3-4 and this has not changed. The right L3 pedicle screw skirts the superolateral border of the pedicle and tracks just lateral to the cortical margin of the L3 vertebral body in the paraspinal tissues. The right L2 nerve tracks just above and lateral to the threading of the right L3 pedicle screw. Solid interbody fusion at L4-5. Posterior decompression from L2-3 through L4-5. Small right pleural effusion with mild bibasilar atelectasis. Aortoiliac atherosclerotic vascular disease. Expected findings from recent operative intervention along the posterior subcutaneous tissues. No visible impingement at the L3-4 or L4-5 level, despite the intervertebral spurring in the right neural foramen at L4-5. No other impingement identified. IMPRESSION: 1. The right L3 pedicle screw captures the superolateral margin of the pedicle and abuts the lateral cortical margin of the vertebral body. The right L2 nerve skirts along the superior and lateral margin of the threatening of the screw, and could be conceivably irritated by the screw. Otherwise expected postoperative appearance. 2. Please note that as on prior exams, the L5 vertebral level is sacralized/transitional. Careful correlation with this numbering strategy, which is stable compared to the prior CT scan, is recommended. Radiology assistant personnel have been notified to put me in telephone contact with the referring physician or the referring physician's clinical representative in  order to discuss these findings. Once this communication is established I will issue an addendum to this report for documentation purposes. Electronically Signed: By:  Van Clines M.D. On: 04/13/2016 12:52   Dg Abd 2 Views  04/12/2016  CLINICAL DATA:  Ileus. EXAM: ABDOMEN - 2 VIEW COMPARISON:  None. FINDINGS: Bowel gas pattern is nonobstructive. No convincing evidence of ileus. Gas seen to the level of the rectum. A left-sided drain is seen. No other acute abnormalities. Pedicle rods and screws overlie the lower lumbar spine. IMPRESSION: No definite ileus.  No acute abnormalities. Electronically Signed   By: Dorise Bullion III M.D   On: 04/12/2016 11:54    Assessment/Plan: 82 year old female status post L3 for fusion with progressive deterioration and when those over the weekend CT scan obtained yesterday shows a migrated right L3 screw in the lateral aspect of the vertebral body this will have to be repositioned. However stab lower this morning shows hematocrit of 18 hemoglobin 5.9 I have ordered a blood transfusion we are still awaiting electrolytes I have called [facility] come see her plan because she is so globally weak and anemic is to keep her bed rest transfuse her increase her overall functional status metabolically with transfusions and the anterior rough implant OR either late tomorrow or Wednesday more likely for repositioning of her right L3 screw.  LOS: 5 days    Stacie Roman P 04/14/2016, 11:00 AM

## 2016-04-14 NOTE — Clinical Social Work Note (Signed)
Clinical Social Work Assessment  Patient Details  Name: Stacie Roman MRN: [id number] Date of Birth: 1930-12-01  Date of referral:  04/14/16               Reason for consult:  Facility Placement                Permission sought to share information with:  Facility Sport and exercise psychologist, Family Supports Permission granted to share information::  Yes, Verbal Permission Granted  Name::     Actor::  SNFs  Relationship::  Son  Contact Information:  [phone]  Housing/Transportation Living arrangements for the past 2 months:  [place] of Information:  Patient, Adult Children Patient Interpreter Needed:  None Criminal Activity/Legal Involvement Pertinent to Current Situation/Hospitalization:  No - Comment as needed Significant Relationships:  Adult Children Lives with:  Self Do you feel safe going back to the place where you live?  No Need for family participation in patient care:  Yes (Comment)  Care giving concerns:  CSW received referral for possible SNF placement at time of discharge. Patient and son are now open to SNF placement if patient does not improve. Patient and patient's son expressed understanding of PT recommendation and are agreeable to SNF placement at time of discharge. CSW to continue to follow and assist with discharge planning needs.   Social Worker assessment / plan:  CSW spoke with patient and patient's son concerning possibility of rehab at [facility] before returning home.  Employment status:  Retired Nurse, adult PT Recommendations:  [place] / Referral to community resources:  [place]  Patient/Family's Response to care:  Patient and patient's son recognize need for rehab before returning home and would like a SNF near [place].  Patient/Family's Understanding of and Emotional Response to Diagnosis, Current Treatment, and Prognosis:  Patient is realistic regarding  therapy needs. No questions/concerns about plan or treatment.    Emotional Assessment Appearance:  Appears stated age Attitude/Demeanor/Rapport:  Other (Appropriate) Affect (typically observed):  Accepting, Appropriate Orientation:  Oriented to Self, Oriented to Place, Oriented to  Time, Oriented to Situation Alcohol / Substance use:  Not Applicable Psych involvement (Current and /or in the community):  No (Comment)  Discharge Needs  Concerns to be addressed:  Care Coordination Readmission within the last 30 days:  No Current discharge risk:  None Barriers to Discharge:  Continued Medical Work up   [employer], [place] 04/14/2016, 12:24 PM

## 2016-04-14 NOTE — Progress Notes (Signed)
OT Cancellation Note  Patient Details Name: Stacie Roman MRN: [id number] DOB: 07/01/1931   Cancelled Treatment:    Reason Eval/Treat Not Completed: Medical issues which prohibited therapy. Pt with hgb of 5.9. Will continue to follow.  Malka So 04/14/2016, 11:20 AM  [phone]

## 2016-04-14 NOTE — Progress Notes (Signed)
PT Cancellation Note  Patient Details Name: Stacie Roman MRN: [id number] DOB: Sep 10, 1931   Cancelled Treatment:    Reason Eval/Treat Not Completed: Medical issues which prohibited therapy; patient with hemoglobin 5.9 and note need for return to surgery.  Will check on pt Wednesday if goes to OR tomorrow.    Reginia Naas 04/14/2016, 1:02 PM  Magda Kiel, [place] 04/14/2016

## 2016-04-14 NOTE — Progress Notes (Signed)
Patient was slowly lowered to the floor while being assisted from the bedside commode to bed. No apparent injuries noted. MD made aware daughter Stacie Roman present. V/S T98.8 P63, R20 B/P 141/54.

## 2016-04-14 NOTE — Care Management Note (Signed)
Case Management Note  Patient Details  Name: Stacie Roman MRN: [id number] Date of Birth: 1931/07/26  Subjective/Objective:                    Action/Plan: Plan is to go back to OR possibly tomorrow. Family asking about SNF d/t patients weakness. CM following for d/c needs.   Expected Discharge Date:                  Expected Discharge Plan:     In-House Referral:     Discharge planning Services     Post Acute Care Choice:    Choice offered to:     DME Arranged:    DME Agency:     HH Arranged:    HH Agency:     Status of Service:  In process, will continue to follow  Medicare Important Message Given:  Yes Date Medicare IM Given:    Medicare IM give by:    Date Additional Medicare IM Given:    Additional Medicare Important Message give by:     If discussed at [place] of Stay Meetings, dates discussed:    Additional Comments:  Pollie Friar, RN 04/14/2016, 4:16 PM

## 2016-04-14 NOTE — Progress Notes (Signed)
  Echocardiogram 2D Echocardiogram has been performed.  Stacie Roman 04/14/2016, 1:52 PM

## 2016-04-14 NOTE — Consult Note (Signed)
Medical Consultation   Stacie Roman  [id number]  DOB: 08/05/1931  DOA: 04/09/2016  PCP: [name], MD   Outpatient Specialists: Neurosurgery/Neurology,Nephrology,Pulmonology,   Requesting physician: Dr Saintclair Halsted - Neurosurgery  Reason for consultation: Anemia, AKI     History of Present Illness: Stacie Roman is an 82 y.o. female with past medical history significant for CHF with cardiac pacemaker, CVA, HTN, hypothyroidism, GERD, frequent nosebleeds, CJD, COPD who presented to [facility] on 04/09/2016 for elective L4-L5 fusion due to progressive worsening bilateral back and leg pain with right neurogenic claudication. Patient initially did well postoperatively approximately 2-1/2 days ago developed significant generalized weakness and fatigue. Additionally patient is also complaining of worsening right lower leg pain. Repeat imaging reveals possible impingement of one of the screws on the nerve necessitating operative repair. Per Dr. Saintclair Halsted is scheduled for 04/15/2016 or 04/16/2016. Patient otherwise denies any symptoms of chest pain, palpitations, cough, shortness of breath/dyspnea, fevers, dysuria, frequency. Patient states she has or back pain which appears to be consistent with postoperative pain.    Review of Systems:  ROS As per HPI otherwise 10 point review of systems negative.    Past Medical History: Past Medical History  Diagnosis Date  . Presence of permanent cardiac pacemaker   . CHF (congestive heart failure) ([place])   . Stroke [facility]) 2010    had aphasia , treated at [facility]- all resolved   . Hypertension   . Asthma   . Encounter for TB tine test 1955    treated /w medicine due to + tine test  . Hypothyroidism   . GERD (gastroesophageal reflux disease)   . Arthritis     DDD, neck & low back   . Bleeding nose     tx with cauterized at time   . Cancer ([place]) 1990's    removed - from face, ? Pre CA  . Chronic kidney disease    stage 4- renal failure , stent in each kidney   . COPD (chronic obstructive pulmonary disease) ([place])     Past Surgical History: Past Surgical History  Procedure Laterality Date  . Renal stents   02/2015    followed by CHF  . Back surgery  2001    lumbar  . Eye surgery      IOL followed cataracats being removed.   Marland Kitchen Appendectomy    . Abdominal hysterectomy    . Insert / replace / remove pacemaker  06/2015    [employer]   . Lumbar fusion  06/072017     Allergies:   Allergies  Allergen Reactions  . Hydralazine Other (See Comments) and Shortness Of Breath    Dizzy, lightheaded Knocks her out  . Mushroom Extract Complex Anaphylaxis, Shortness Of Breath and Swelling    Throat swelling  . Aspirin Other (See Comments)    Bleeding disorder  . Morphine And Related Nausea And Vomiting     Social History:  reports that she quit smoking about 57 years ago. She has never used smokeless tobacco. She reports that she does not drink alcohol or use illicit drugs.   Family History: Family History  Problem Relation Age of Onset  . Family history unknown: Yes   Non-contributory to current problem   Physical Exam: Filed Vitals:   04/14/16 0555 04/14/16 0857 04/14/16 1142 04/14/16 1302  BP: 145/52 130/36 135/38 137/53  Pulse: 69  59 62  Temp: 97.9  F (36.6 C)  97.5 F (36.4 C) 97.9 F (36.6 C)  TempSrc: Oral  Axillary Oral  Resp: 18 16 16 20   Height:      Weight:      SpO2: 99% 96% 98% 98%    Constitutional: Appearance,  Alert and awake, not in any acute distress. Eyes: PERLA, EOMI, irises appear normal, anicteric sclera,  ENMT: external ears and nose appear normal, normal hearing or hard of hearing, Lips appears normal,  Neck: neck appears normal, no masses, normal ROM, no thyromegaly,  CVS: S1-S2 clear, II/VI systolic murmur, no LE edema, normal pedal pulses  Respiratory:  clear to auscultation bilaterally, no wheezing, rales or rhonchi. Respiratory effort normal. No  accessory muscle use.  Abdomen: soft nontender, nondistended, normal bowel sounds,  Musculoskeletal: : no cyanosis, clubbing or edema noted bilaterally,  Pt not willing to move at this time due to lower back pain.  Neuro: Cranial nerves II-XII intact, sensation Psych: judgement and insight appear normal, stable mood and affect, mental status Skin: no rashes or lesions or ulcers, no induration or nodules   Data reviewed:  I have personally reviewed following labs and imaging studies Labs:  CBC:  Recent Labs Lab 04/14/16 0941  WBC 12.3*  HGB 5.9*  HCT 18.1*  MCV 94.8  PLT [id number]    Basic Metabolic Panel:  Recent Labs Lab 04/14/16 0941  NA 131*  K 3.7  CL 98*  CO2 20*  GLUCOSE 171*  BUN 61*  CREATININE 4.36*  CALCIUM 8.0*   GFR Estimated Creatinine Clearance: 10.7 mL/min (by C-G formula based on Cr of 4.36). Liver Function Tests:  Recent Labs Lab 04/14/16 0941  AST 12*  ALT <5*  ALKPHOS 73  BILITOT 0.5  PROT 5.1*  ALBUMIN 2.0*   No results for input(s): LIPASE, AMYLASE in the last 168 hours. No results for input(s): AMMONIA in the last 168 hours. Coagulation profile No results for input(s): INR, PROTIME in the last 168 hours.  Cardiac Enzymes: No results for input(s): CKTOTAL, CKMB, CKMBINDEX, TROPONINI in the last 168 hours. BNP: Invalid input(s): POCBNP CBG: No results for input(s): GLUCAP in the last 168 hours. D-Dimer No results for input(s): DDIMER in the last 72 hours. Hgb A1c No results for input(s): HGBA1C in the last 72 hours. Lipid Profile No results for input(s): CHOL, HDL, LDLCALC, TRIG, CHOLHDL, LDLDIRECT in the last 72 hours. Thyroid function studies No results for input(s): TSH, T4TOTAL, T3FREE, THYROIDAB in the last 72 hours.  Invalid input(s): FREET3 Anemia work up No results for input(s): VITAMINB12, FOLATE, FERRITIN, TIBC, IRON, RETICCTPCT in the last 72 hours. Urinalysis No results found for: COLORURINE, APPEARANCEUR, LABSPEC,  [place], GLUCOSEU, HGBUR, BILIRUBINUR, KETONESUR, PROTEINUR, UROBILINOGEN, NITRITE, [place]   Microbiology No results found for this or any previous visit (from the past 240 hour(s)).     Inpatient Medications:   Scheduled Meds: . acetaminophen  1,000 mg Oral BH-q7a  . aspirin EC  81 mg Oral BH-q7a  . bisacodyl  10 mg Oral QHS  . bisacodyl  5 mg Oral Daily  . bupivacaine liposome  20 mL Infiltration Once  . cholecalciferol  5,000 Units Oral BH-q7a  . cloNIDine  0.1 mg Oral BID  . docusate sodium  200 mg Oral BID  . isosorbide mononitrate  90 mg Oral q1800  . labetalol  400 mg Oral TID  . levothyroxine  50 mcg Oral QAC breakfast  . loratadine  10 mg Oral BH-q7a  . mometasone-formoterol  2 puff  Inhalation BID  . NIFEdipine  60 mg Oral BID  . oxybutynin  10 mg Oral q1800  . polyethylene glycol  17 g Oral Daily  . sodium chloride flush  3 mL Intravenous Q12H  . [START ON 04/15/2016] torsemide  20 mg Oral BH-q7a   Continuous Infusions: . sodium chloride       Radiological Exams on Admission: Ct Lumbar Spine Wo Contrast  04/13/2016  ADDENDUM REPORT: 04/13/2016 13:32 ADDENDUM: The original report was by Dr. Van Clines. The following addendum is by Dr. Van Clines: These results were called by telephone at the time of interpretation on 04/13/2016 at 1:28 pm to Dr. Ashok Pall , who verbally acknowledged these results. Electronically Signed   By: Van Clines M.D.   On: 04/13/2016 13:32  04/13/2016  CLINICAL DATA:  Hardware placement. Weakness. Back pain common discomfort. Recent L3-4 fusion 04/09/2016 EXAM: CT LUMBAR SPINE WITHOUT CONTRAST TECHNIQUE: Multidetector CT imaging of the lumbar spine was performed without intravenous contrast administration. Multiplanar CT image reconstructions were also generated. COMPARISON:  Multiple exams, including 04/09/2016 and 10/11/2015 FINDINGS: Partially sacralized L5 vertebra noted. This numbering is consistent with the  recent prior CT of 10/11/2015. Compared to 10/11/2015, the L4-5 posterolateral rod and pedicle screw fixation has been removed and there has been placement of posterolateral rod and pedicle screw fixators at L3-4. Interbody spacer noted at L3-4. There was previously 6 mm anterolisthesis at L3-4 and this has not changed. The right L3 pedicle screw skirts the superolateral border of the pedicle and tracks just lateral to the cortical margin of the L3 vertebral body in the paraspinal tissues. The right L2 nerve tracks just above and lateral to the threading of the right L3 pedicle screw. Solid interbody fusion at L4-5. Posterior decompression from L2-3 through L4-5. Small right pleural effusion with mild bibasilar atelectasis. Aortoiliac atherosclerotic vascular disease. Expected findings from recent operative intervention along the posterior subcutaneous tissues. No visible impingement at the L3-4 or L4-5 level, despite the intervertebral spurring in the right neural foramen at L4-5. No other impingement identified. IMPRESSION: 1. The right L3 pedicle screw captures the superolateral margin of the pedicle and abuts the lateral cortical margin of the vertebral body. The right L2 nerve skirts along the superior and lateral margin of the threatening of the screw, and could be conceivably irritated by the screw. Otherwise expected postoperative appearance. 2. Please note that as on prior exams, the L5 vertebral level is sacralized/transitional. Careful correlation with this numbering strategy, which is stable compared to the prior CT scan, is recommended. Radiology assistant personnel have been notified to put me in telephone contact with the referring physician or the referring physician's clinical representative in order to discuss these findings. Once this communication is established I will issue an addendum to this report for documentation purposes. Electronically Signed: By: Van Clines M.D. On: 04/13/2016  12:52    Impression/Recommendations Active Problems:   Spondylolisthesis at [id number] level   Diastolic CHF (HCC)   Normocytic anemia   AKI (acute kidney injury) ([place])   CKD (chronic kidney disease) stage 4, GFR 15-29 ml/min (HCC)   Anemia: Hgb 5.9, baseline 10. Likely from combination of operative loss, Worsening renal function, and GI. Normocytic. Per family, this has happened w/ previous surgical procedures - FOBT - Transfuse 2 units PRBC - post transfusion labs - iron studies and anemia panel not likely beneficial at this point as PRBC already started.  - hold PO iron, consider IV iron if needed  Spondy: POD# 5 from L4-5 fusion. CT showing possible loosening of the screw resulting in nerve irritation. Dr Saintclair Halsted planning on taking back to to OR on either 6/13 or 6/15.  - mgt per primary team  AoCKD: Renal function at baseline 2 wks ago showing Cr 2.58 and BUN 40. Today Cr 4.36 and BUN 61. Suspect from poor perfusion from anemia and diuretic use. Followed by [facility] nephrology. - BMET - decreased torsemide to 20mg  daily for 1-2 days pending renal improvement - IVF NS 74ml/hr  CHF: Echo at [facility] 12/2014 showing EF 55% and mild basal hypertrophy suggestive of possible diastolic dysfunction.  - Echo - Strict I/O, Dly wts - torsemide as above - CXR.   HTN, Hypothyroidism, constipation, COPD: mgt per primary team.    Thank you for this consultation.  Our [facility] hospitalist team will follow the patient with you.     MERRELL, DAVID J M.D. [facility] 04/14/2016, 1:15 PM

## 2016-04-15 LAB — BASIC METABOLIC PANEL
ANION GAP: 14 (ref 5–15)
BUN: 62 mg/dL — ABNORMAL HIGH (ref 6–20)
CALCIUM: 8.4 mg/dL — AB (ref 8.9–10.3)
CO2: 22 mmol/L (ref 22–32)
CREATININE: 3.63 mg/dL — AB (ref 0.44–1.00)
Chloride: 99 mmol/L — ABNORMAL LOW (ref 101–111)
GFR, EST AFRICAN AMERICAN: 12 mL/min — AB (ref >60)
GFR, EST NON AFRICAN AMERICAN: 11 mL/min — AB (ref >60)
Glucose, Bld: 110 mg/dL — ABNORMAL HIGH (ref 65–99)
Potassium: 4 mmol/L (ref 3.5–5.1)
SODIUM: 135 mmol/L (ref 135–145)

## 2016-04-15 LAB — CBC
HCT: 25 % — ABNORMAL LOW (ref 36.0–46.0)
HCT: 26.4 % — ABNORMAL LOW (ref 36.0–46.0)
Hemoglobin: 8.2 g/dL — ABNORMAL LOW (ref 12.0–15.0)
Hemoglobin: 8.7 g/dL — ABNORMAL LOW (ref 12.0–15.0)
MCH: 28.7 pg (ref 26.0–34.0)
MCH: 28.8 pg (ref 26.0–34.0)
MCHC: 32.8 g/dL (ref 30.0–36.0)
MCHC: 33 g/dL (ref 30.0–36.0)
MCV: 87.4 fL (ref 78.0–100.0)
MCV: 87.4 fL (ref 78.0–100.0)
PLATELETS: 229 10*3/uL (ref 150–400)
PLATELETS: 238 10*3/uL (ref 150–400)
RBC: 2.86 MIL/uL — ABNORMAL LOW (ref 3.87–5.11)
RBC: 3.02 MIL/uL — AB (ref 3.87–5.11)
RDW: 15.8 % — AB (ref 11.5–15.5)
RDW: 16.7 % — ABNORMAL HIGH (ref 11.5–15.5)
WBC: 11.6 10*3/uL — ABNORMAL HIGH (ref 4.0–10.5)
WBC: 10.7 10*3/uL — AB (ref 4.0–10.5)

## 2016-04-15 MED ORDER — SODIUM CHLORIDE 0.9 % IV SOLN
INTRAVENOUS | Status: AC
  Administered 2016-04-16: 03:00:00 via INTRAVENOUS

## 2016-04-15 MED ORDER — DIPHENHYDRAMINE HCL 50 MG/ML IJ SOLN
INTRAMUSCULAR | Status: AC
  Filled 2016-04-15: qty 1

## 2016-04-15 MED ORDER — DIPHENHYDRAMINE HCL 50 MG/ML IJ SOLN
12.5000 mg | Freq: Four times a day (QID) | INTRAMUSCULAR | Status: DC | PRN
  Administered 2016-04-15: 12.5 mg via INTRAVENOUS

## 2016-04-15 MED ORDER — DIPHENHYDRAMINE HCL 25 MG PO CAPS
25.0000 mg | ORAL_CAPSULE | Freq: Four times a day (QID) | ORAL | Status: DC | PRN
  Filled 2016-04-15: qty 1

## 2016-04-15 MED ORDER — DIPHENHYDRAMINE HCL 50 MG/ML IJ SOLN
12.5000 mg | Freq: Four times a day (QID) | INTRAMUSCULAR | Status: DC | PRN
  Administered 2016-04-16: 12.5 mg via INTRAVENOUS
  Filled 2016-04-15: qty 1

## 2016-04-15 MED ORDER — ACETAMINOPHEN-CODEINE #3 300-30 MG PO TABS
1.0000 | ORAL_TABLET | ORAL | Status: DC | PRN
  Administered 2016-04-15 (×2): 1 via ORAL
  Administered 2016-04-16 – 2016-04-17 (×4): 2 via ORAL
  Filled 2016-04-15: qty 1
  Filled 2016-04-15 (×2): qty 2
  Filled 2016-04-15: qty 1
  Filled 2016-04-15 (×2): qty 2

## 2016-04-15 NOTE — Progress Notes (Signed)
Subjective: Patient reports Do much better this morning condition of back pain  Objective: Vital signs in last 24 hours: Temp:  [97.5 F (36.4 C)-99 F (37.2 C)] 98.4 F (36.9 C) (06/13 0500) Pulse Rate:  [59-69] 66 (06/13 0500) Resp:  [16-20] 20 (06/13 0500) BP: (126-171)/(36-59) 161/45 mmHg (06/13 0500) SpO2:  [96 %-99 %] 99 % (06/13 0500) Weight:  [94.711 kg (208 lb 12.8 oz)] 94.711 kg (208 lb 12.8 oz) (06/13 0500)  Intake/Output from previous day: 06/12 0701 - 06/13 0700 In: 1320.8 [P.O.:480; I.V.:55; Blood:785.8] Out: 900 [Urine:900] Intake/Output this shift:    Awake alert much more alert. Moves all extremities better over antigravity strength in the right looks to me so improved.  Lab Results:  Recent Labs  04/14/16 2052 04/15/16 0632  WBC 11.6* 10.7*  HGB 8.2* 8.7*  HCT 25.0* 26.4*  PLT 229 238   BMET  Recent Labs  04/14/16 0941 04/15/16 0632  NA 131* 135  K 3.7 4.0  CL 98* 99*  CO2 20* 22  GLUCOSE 171* 110*  BUN 61* 62*  CREATININE 4.36* 3.63*  CALCIUM 8.0* 8.4*    Studies/Results: Ct Lumbar Spine Wo Contrast  04/13/2016  ADDENDUM REPORT: 04/13/2016 13:32 ADDENDUM: The original report was by Dr. Van Clines. The following addendum is by Dr. Van Clines: These results were called by telephone at the time of interpretation on 04/13/2016 at 1:28 pm to Dr. Ashok Pall , who verbally acknowledged these results. Electronically Signed   By: Van Clines M.D.   On: 04/13/2016 13:32  04/13/2016  CLINICAL DATA:  Hardware placement. Weakness. Back pain common discomfort. Recent L3-4 fusion 04/09/2016 EXAM: CT LUMBAR SPINE WITHOUT CONTRAST TECHNIQUE: Multidetector CT imaging of the lumbar spine was performed without intravenous contrast administration. Multiplanar CT image reconstructions were also generated. COMPARISON:  Multiple exams, including 04/09/2016 and 10/11/2015 FINDINGS: Partially sacralized L5 vertebra noted. This numbering is  consistent with the recent prior CT of 10/11/2015. Compared to 10/11/2015, the L4-5 posterolateral rod and pedicle screw fixation has been removed and there has been placement of posterolateral rod and pedicle screw fixators at L3-4. Interbody spacer noted at L3-4. There was previously 6 mm anterolisthesis at L3-4 and this has not changed. The right L3 pedicle screw skirts the superolateral border of the pedicle and tracks just lateral to the cortical margin of the L3 vertebral body in the paraspinal tissues. The right L2 nerve tracks just above and lateral to the threading of the right L3 pedicle screw. Solid interbody fusion at L4-5. Posterior decompression from L2-3 through L4-5. Small right pleural effusion with mild bibasilar atelectasis. Aortoiliac atherosclerotic vascular disease. Expected findings from recent operative intervention along the posterior subcutaneous tissues. No visible impingement at the L3-4 or L4-5 level, despite the intervertebral spurring in the right neural foramen at L4-5. No other impingement identified. IMPRESSION: 1. The right L3 pedicle screw captures the superolateral margin of the pedicle and abuts the lateral cortical margin of the vertebral body. The right L2 nerve skirts along the superior and lateral margin of the threatening of the screw, and could be conceivably irritated by the screw. Otherwise expected postoperative appearance. 2. Please note that as on prior exams, the L5 vertebral level is sacralized/transitional. Careful correlation with this numbering strategy, which is stable compared to the prior CT scan, is recommended. Radiology assistant personnel have been notified to put me in telephone contact with the referring physician or the referring physician's clinical representative in order to discuss these findings. Once  this communication is established I will issue an addendum to this report for documentation purposes. Electronically Signed: By: Van Clines  M.D. On: 04/13/2016 12:52   Dg Chest Port 1 View  04/14/2016  CLINICAL DATA:  Status post lumbar surgery 5 days ago. Hypertension. Congestive heart failure. EXAM: PORTABLE CHEST 1 VIEW COMPARISON:  PA and lateral chest 06/16/2015. FINDINGS: There is cardiomegaly without edema. The lungs are clear. No pneumothorax or pleural effusion. Pacing device is noted. IMPRESSION: No acute disease. Electronically Signed   By: Inge Rise M.D.   On: 04/14/2016 14:16    Assessment/Plan: Continue to resuscitate with fluids okay for patient to get out of bed will plan exploration of fusion repositioning of screw tomorrow.  LOS: 6 days     Shanyla Marconi P 04/15/2016, 7:40 AM

## 2016-04-15 NOTE — Progress Notes (Signed)
PROGRESS NOTE                                                                                                                                                                                                             Patient Demographics:    Stacie Roman, is a 82 y.o. female, DOB - [birth year], [id number]  Admit date - 04/09/2016   Admitting Physician Kary Kos, MD  Outpatient Primary MD for the patient is [name], MD  LOS - 6   No chief complaint on file.      Brief Narrative  82 y.o. female with past medical history significant for CHF with cardiac pacemaker, CVA, HTN, hypothyroidism, GERD, frequent nosebleeds, CJD, COPD who presented to [facility] on 04/09/2016 for elective L4-L5 fusion , Hospitalist service consulted for post operative anemia, and worsening renal function.   Subjective:    Stacie Roman today has, No headache, No chest pain, No abdominal pain - No Nausea,  No Cough - SOB.    Assessment  & Plan :    Active Problems:   Spondylolisthesis at [id number] level   Diastolic CHF (HCC)   Normocytic anemia   AKI (acute kidney injury) ([place])   CKD (chronic kidney disease) stage 4, GFR 15-29 ml/min (HCC)   Weakness   Anemia - Hgb 5.9, baseline 10. Likely from combination of operative loss, Worsening renal function, and GI. Normocytic. Per family, this has happened w/ previous surgical procedures - FOBT pending - Transfused 2 units PRBC 6/12, hemoglobin improved, today is 8.7, monitor closely and transfuse as needed - iron studies and anemia panel not likely beneficial at this point as PRBC already started.  - hold PO iron, consider IV iron if needed  Acute on chronic kidney disease stage III - Baseline creatinine 2.5, peaked at 4.3, most likely due to prerenal giving anemia and diuretic use - Will give gentle hydration today giving known history of diastolic CHF, and will hold torsemide, difficult function at  baseline tomorrow, resume torsemide at a lower dose - Check BMP in a.m.  Chronic diastolic CHF - Vehicle showing EF [id number], grade 2 diastolic dysfunction. - Currently appears to be dry, continue with gentle hydration today, her renal function improved to resume on torsemide tomorrow  HTN - Continue with home medication,   Hypothyroidism - Continue with levothyroxine  COPD - Continue with nebs as needed   Lab Results  Component Value Date   PLT 238 04/15/2016    Antibiotics  :    Anti-infectives    Start     Dose/Rate Route Frequency Ordered Stop   04/09/16 2100  ceFAZolin (ANCEF) IVPB 1 g/50 mL premix     1 g 100 mL/hr over 30 Minutes Intravenous Every 12 hours 04/09/16 1539 04/11/16 1029   04/09/16 1539  acyclovir (ZOVIRAX) tablet 800 mg  Status:  Discontinued     800 mg Oral Every 8 hours PRN 04/09/16 1539 04/15/16 1359   04/09/16 1154  vancomycin (VANCOCIN) powder  Status:  Discontinued       As needed 04/09/16 1154 04/09/16 1231   04/09/16 1149  vancomycin (VANCOCIN) 1000 MG powder    Comments:  Atilano Median   : cabinet override      04/09/16 1149 04/09/16 2359   04/09/16 1005  50,000 units bacitracin in 0.9% normal saline 250 mL irrigation  Status:  Discontinued       As needed 04/09/16 1005 04/09/16 1231   04/09/16 0600  ceFAZolin (ANCEF) IVPB 2g/100 mL premix     2 g 200 mL/hr over 30 Minutes Intravenous To [facility] 04/08/16 1239 04/09/16 0855        Objective:   Filed Vitals:   04/15/16 0132 04/15/16 0500 04/15/16 1028 04/15/16 1314  BP: 126/59 161/45 150/50 159/51  Pulse: 69 66 58 66  Temp: 98.2 F (36.8 C) 98.4 F (36.9 C) 98.7 F (37.1 C) 98.5 F (36.9 C)  TempSrc: Oral Oral Oral Oral  Resp: 20 20 20 20   Height:      Weight:  94.711 kg (208 lb 12.8 oz)    SpO2: 99% 99% 97% 95%    Wt Readings from Last 3 Encounters:  04/15/16 94.711 kg (208 lb 12.8 oz)  04/02/16 87.573 kg (193 lb 1 oz)     Intake/Output Summary (Last 24 hours)  at 04/15/16 1359 Last data filed at 04/15/16 1103  Gross per 24 hour  Intake 1279.58 ml  Output    900 ml  Net 379.58 ml     Physical Exam  Awake Alert, Oriented X 3,  .AT,PERRAL Supple Neck,No JVD, No cervical lymphadenopathy appriciated.  Symmetrical Chest wall movement, Good air movement bilaterally, CTAB RRR,No Gallops,Rubs or new Murmurs, No Parasternal Heave +ve B.Sounds, Abd Soft, No tenderness,  No Cyanosis, Clubbing or edema, No new Rash or bruise      Data Review:    CBC  Recent Labs Lab 04/14/16 0941 04/14/16 2052 04/15/16 0632  WBC 12.3* 11.6* 10.7*  HGB 5.9* 8.2* 8.7*  HCT 18.1* 25.0* 26.4*  PLT 239 229 238  MCV 94.8 87.4 87.4  MCH 30.9 28.7 28.8  MCHC 32.6 32.8 33.0  RDW 13.1 15.8* 16.7*    Chemistries   Recent Labs Lab 04/14/16 0941 04/15/16 0632  NA 131* 135  K 3.7 4.0  CL 98* 99*  CO2 20* 22  GLUCOSE 171* 110*  BUN 61* 62*  CREATININE 4.36* 3.63*  CALCIUM 8.0* 8.4*  AST 12*  --   ALT <5*  --   ALKPHOS 73  --   BILITOT 0.5  --    ------------------------------------------------------------------------------------------------------------------ No results for input(s): CHOL, HDL, LDLCALC, TRIG, CHOLHDL, LDLDIRECT in the last 72 hours.  No results found for: HGBA1C ------------------------------------------------------------------------------------------------------------------ No results for input(s): TSH, T4TOTAL, T3FREE, THYROIDAB in the last 72 hours.  Invalid input(s): FREET3 ------------------------------------------------------------------------------------------------------------------ No  results for input(s): VITAMINB12, FOLATE, FERRITIN, TIBC, IRON, RETICCTPCT in the last 72 hours.  Coagulation profile No results for input(s): INR, PROTIME in the last 168 hours.  No results for input(s): DDIMER in the last 72 hours.  Cardiac Enzymes No results for input(s): CKMB, TROPONINI, MYOGLOBIN in the last 168 hours.  Invalid  input(s): CK ------------------------------------------------------------------------------------------------------------------ No results found for: BNP  Inpatient Medications  Scheduled Meds: . acetaminophen  1,000 mg Oral BH-q7a  . aspirin EC  81 mg Oral BH-q7a  . bisacodyl  10 mg Oral QHS  . bisacodyl  5 mg Oral Daily  . bupivacaine liposome  20 mL Infiltration Once  . cholecalciferol  5,000 Units Oral BH-q7a  . cloNIDine  0.1 mg Oral BID  . diphenhydrAMINE      . docusate sodium  200 mg Oral BID  . isosorbide mononitrate  90 mg Oral q1800  . labetalol  400 mg Oral TID  . levothyroxine  50 mcg Oral QAC breakfast  . loratadine  10 mg Oral BH-q7a  . mometasone-formoterol  2 puff Inhalation BID  . NIFEdipine  60 mg Oral BID  . oxybutynin  10 mg Oral q1800  . polyethylene glycol  17 g Oral Daily  . torsemide  20 mg Oral BH-q7a   Continuous Infusions: . sodium chloride     PRN Meds:.acetaminophen **OR** acetaminophen, acetaminophen-codeine, albuterol, bisacodyl, cyclobenzaprine, diphenhydrAMINE, HYDROmorphone (DILAUDID) injection, menthol-cetylpyridinium **OR** phenol, ondansetron (ZOFRAN) IV, oxyCODONE-acetaminophen, senna-docusate  Micro Results No results found for this or any previous visit (from the past 240 hour(s)).  Radiology Reports Dg Lumbar Spine 2-3 Views  04/09/2016  CLINICAL DATA:  Post L3-L4 fusion EXAM: LUMBAR SPINE - 2-3 VIEW; DG C-ARM 61-120 MIN COMPARISON:  Lumbar spine CT 10/11/2015 FINDINGS: Posterior transpedicular screws noted I assume at L3-L4 level. There is a disc spacer I assume L3 L4 level. The previous metallic screw at L5 level is not identified. There is anatomic alignment. Please note the lower lumbar spine is not visualized therefore numbering levels is very difficult. IMPRESSION: Posterior transpedicular screws noted I assume at L3-L4 level. There is a disc spacer I assume L3 L4 level. The previous metallic screw at L5 level is not identified. There  is anatomic alignment. Please note the lower lumbar spine is not visualized therefore numbering levels is very difficult. Fluoroscopy time was 29 seconds.  Please see the operative report. These results were called by telephone at the time of interpretation on 04/09/2016 at 12:49 pm to Dr. Kary Kos , who verbally acknowledged these results. Electronically Signed   By: Lahoma Crocker M.D.   On: 04/09/2016 12:50   Ct Lumbar Spine Wo Contrast  04/13/2016  ADDENDUM REPORT: 04/13/2016 13:32 ADDENDUM: The original report was by Dr. Van Clines. The following addendum is by Dr. Van Clines: These results were called by telephone at the time of interpretation on 04/13/2016 at 1:28 pm to Dr. Ashok Pall , who verbally acknowledged these results. Electronically Signed   By: Van Clines M.D.   On: 04/13/2016 13:32  04/13/2016  CLINICAL DATA:  Hardware placement. Weakness. Back pain common discomfort. Recent L3-4 fusion 04/09/2016 EXAM: CT LUMBAR SPINE WITHOUT CONTRAST TECHNIQUE: Multidetector CT imaging of the lumbar spine was performed without intravenous contrast administration. Multiplanar CT image reconstructions were also generated. COMPARISON:  Multiple exams, including 04/09/2016 and 10/11/2015 FINDINGS: Partially sacralized L5 vertebra noted. This numbering is consistent with the recent prior CT of 10/11/2015. Compared to 10/11/2015, the L4-5 posterolateral rod and pedicle screw  fixation has been removed and there has been placement of posterolateral rod and pedicle screw fixators at L3-4. Interbody spacer noted at L3-4. There was previously 6 mm anterolisthesis at L3-4 and this has not changed. The right L3 pedicle screw skirts the superolateral border of the pedicle and tracks just lateral to the cortical margin of the L3 vertebral body in the paraspinal tissues. The right L2 nerve tracks just above and lateral to the threading of the right L3 pedicle screw. Solid interbody fusion at L4-5. Posterior  decompression from L2-3 through L4-5. Small right pleural effusion with mild bibasilar atelectasis. Aortoiliac atherosclerotic vascular disease. Expected findings from recent operative intervention along the posterior subcutaneous tissues. No visible impingement at the L3-4 or L4-5 level, despite the intervertebral spurring in the right neural foramen at L4-5. No other impingement identified. IMPRESSION: 1. The right L3 pedicle screw captures the superolateral margin of the pedicle and abuts the lateral cortical margin of the vertebral body. The right L2 nerve skirts along the superior and lateral margin of the threatening of the screw, and could be conceivably irritated by the screw. Otherwise expected postoperative appearance. 2. Please note that as on prior exams, the L5 vertebral level is sacralized/transitional. Careful correlation with this numbering strategy, which is stable compared to the prior CT scan, is recommended. Radiology assistant personnel have been notified to put me in telephone contact with the referring physician or the referring physician's clinical representative in order to discuss these findings. Once this communication is established I will issue an addendum to this report for documentation purposes. Electronically Signed: By: Van Clines M.D. On: 04/13/2016 12:52   Dg Chest Port 1 View  04/14/2016  CLINICAL DATA:  Status post lumbar surgery 5 days ago. Hypertension. Congestive heart failure. EXAM: PORTABLE CHEST 1 VIEW COMPARISON:  PA and lateral chest 06/16/2015. FINDINGS: There is cardiomegaly without edema. The lungs are clear. No pneumothorax or pleural effusion. Pacing device is noted. IMPRESSION: No acute disease. Electronically Signed   By: Inge Rise M.D.   On: 04/14/2016 14:16   Dg Abd 2 Views  04/12/2016  CLINICAL DATA:  Ileus. EXAM: ABDOMEN - 2 VIEW COMPARISON:  None. FINDINGS: Bowel gas pattern is nonobstructive. No convincing evidence of ileus. Gas seen to  the level of the rectum. A left-sided drain is seen. No other acute abnormalities. Pedicle rods and screws overlie the lower lumbar spine. IMPRESSION: No definite ileus.  No acute abnormalities. Electronically Signed   By: Dorise Bullion III M.D   On: 04/12/2016 11:54   Dg C-arm 1-60 Min  04/09/2016  CLINICAL DATA:  Post L3-L4 fusion EXAM: LUMBAR SPINE - 2-3 VIEW; DG C-ARM 61-120 MIN COMPARISON:  Lumbar spine CT 10/11/2015 FINDINGS: Posterior transpedicular screws noted I assume at L3-L4 level. There is a disc spacer I assume L3 L4 level. The previous metallic screw at L5 level is not identified. There is anatomic alignment. Please note the lower lumbar spine is not visualized therefore numbering levels is very difficult. IMPRESSION: Posterior transpedicular screws noted I assume at L3-L4 level. There is a disc spacer I assume L3 L4 level. The previous metallic screw at L5 level is not identified. There is anatomic alignment. Please note the lower lumbar spine is not visualized therefore numbering levels is very difficult. Fluoroscopy time was 29 seconds.  Please see the operative report. These results were called by telephone at the time of interpretation on 04/09/2016 at 12:49 pm to Dr. Kary Kos , who verbally acknowledged these  results. Electronically Signed   By: Lahoma Crocker M.D.   On: 04/09/2016 12:50    Time Spent in minutes  25 minutes   Ezeriah Luty M.D on 04/15/2016 at 1:59 PM  Between 7am to 7pm - Pager - [phone]  After 7pm go to [email or web address] - password [facility]  [facility] -  Office  [phone]

## 2016-04-15 NOTE — Progress Notes (Signed)
Occupational Therapy Treatment Patient Details Name: Stacie Roman MRN: [id number] DOB: 08-Jul-1931 Today's Date: 04/15/2016    History of present illness pt presents post L3-4 PLIF with hardware removal.  pt with hx of Pacemaker, CHF, CVA, HTN, CA, CKD, COPD, and back surgery.     OT comments  Pt demonstrates fatigue and incr risk for fall this session.pt with knee flexion with attempts to transfer and descending prematurely prior to reaching chair. Recommend RN and [employer] only stand pivot a patient. Pt greatly benefits from elevated surfaces to allow easier sit<>stand. Pt and daughter educated on the benefit of elevated surfaces. Daughter requesting bed to be lowered and toilet to be lowered in room. OT again helping educate patient and daughter on the need for higher surfaces to be able to achieve static standing for patient. Pt with decr ability to power up into standing.   Follow Up Recommendations  SNF;Supervision/Assistance - 24 hour    Equipment Recommendations  Wheelchair cushion (measurements OT);Wheelchair (measurements OT);3 in 1 bedside comode;Hospital bed (possible LIft)    Recommendations for Other Services      Precautions / Restrictions Precautions Precautions: Fall;Back Required Braces or Orthoses: Spinal Brace Spinal Brace: Lumbar corset;Applied in sitting position       Mobility Bed Mobility Overal bed mobility: Needs Assistance Bed Mobility: Rolling;Supine to Sit Rolling: Mod assist   Supine to sit: +2 for physical assistance;Max assist     General bed mobility comments: cues for safety and correct body positioning  Transfers Overall transfer level: Needs assistance Equipment used: Rolling walker (2 wheeled) Transfers: Sit to/from Stand Sit to Stand: +2 physical assistance;Max assist Stand pivot transfers: +2 physical assistance;Max assist            Balance Overall balance assessment: Needs assistance   Sitting balance-Leahy Scale: Poor      Standing balance support: Bilateral upper extremity supported;During functional activity Standing balance-Leahy Scale: Zero                     ADL Overall ADL's : Needs assistance/impaired Eating/Feeding: Minimal assistance;Sitting                       Toilet Transfer: +2 for physical assistance;Moderate assistance;Stand-pivot;Cueing for safety;Cueing for sequencing;RW;BSC Toilet Transfer Details (indicate cue type and reason): Pt required use of bariatric commode at this time due to patient reports inability to sit on standard commode. Pt's daugher having patient lateral lean to lift buttock . Daughter reports I have to help her spread her skin. Pt sitting adequately and properly position. Daughter attempting to shift patient with therapist attempting to offer a safer method for the daughter. Daughter declines. Daughter states "we do it like this"   Toileting - Clothing Manipulation Details (indicate cue type and reason): unabel to void       General ADL Comments: pt fatigues quickly and only able to complete stand pivot transfers safely. pt with knee flexion and buckle with attempts to step into walker. pt attempting to sit prematurely and needed total + 2 Max (A) to descend to chair safely      Vision                     Perception     Praxis      Cognition   Behavior During Therapy: Flat affect Overall Cognitive Status: Impaired/Different from baseline Area of Impairment: Memory;Safety/judgement;Awareness     Memory: Decreased recall of precautions  Safety/Judgement: Decreased awareness of safety;Decreased awareness of deficits Awareness: Intellectual Problem Solving: Slow processing;Difficulty sequencing;Decreased initiation;Requires verbal cues General Comments: pt needed cues for safety. pt grabbing at therapist even after education on hand placement and use of RW    Extremity/Trunk Assessment               Exercises      Shoulder Instructions       General Comments      Pertinent Vitals/ Pain       Pain Assessment: Faces Faces Pain Scale: Hurts whole lot Pain Location: back Pain Descriptors / Indicators: Operative site guarding Pain Intervention(s): Monitored during session;Premedicated before session;Repositioned  Home Living                                          Prior Functioning/Environment              Frequency Min 2X/week     Progress Toward Goals  OT Goals(current goals can now be found in the care plan section)  Progress towards OT goals: Progressing toward goals (SLOWLY)  Acute Rehab OT Goals Patient Stated Goal: none stated OT Goal Formulation: With patient/family Time For Goal Achievement: 04/25/16 Potential to Achieve Goals: Fair ADL Goals Pt Will Perform Grooming: with min assist;standing Pt Will Perform Upper Body Bathing: with min guard assist;sitting Pt Will Perform Lower Body Bathing: with min assist;with adaptive equipment;sit to/from stand Pt Will Transfer to Toilet: with min assist;stand pivot transfer;bedside commode Pt Will Perform Toileting - Clothing Manipulation and hygiene: with min assist;with adaptive equipment;sit to/from stand  Plan Discharge plan remains appropriate    Co-evaluation                 End of Session Equipment Utilized During Treatment: Gait belt;Oxygen;Back brace;Rolling walker   Activity Tolerance Patient limited by fatigue   Patient Left in chair;with call bell/phone within reach;with chair alarm set;with family/visitor present   Nurse Communication Mobility status;Precautions        Time: [id number] OT Time Calculation (min): 30 min  Charges: OT General Charges $OT Visit: 1 Procedure OT Treatments $Self Care/Home Management : 23-37 mins  Parke Poisson B 04/15/2016, 1:47 PM   Jeri Modena   OTR/L Pager: [phone] Office: [phone] .

## 2016-04-16 ENCOUNTER — Encounter (HOSPITAL_COMMUNITY)
Admission: RE | Disposition: A | Discharge status: Home Health | Payer: Self-pay | Source: Ambulatory Visit | Attending: Neurosurgery

## 2016-04-16 ENCOUNTER — Inpatient Hospital Stay (HOSPITAL_COMMUNITY): Payer: Medicare HMO | Admitting: Certified Registered Nurse Anesthetist

## 2016-04-16 ENCOUNTER — Inpatient Hospital Stay (HOSPITAL_COMMUNITY): Payer: Medicare HMO

## 2016-04-16 DIAGNOSIS — N179 Acute kidney failure, unspecified: Secondary | ICD-10-CM

## 2016-04-16 DIAGNOSIS — M47816 Spondylosis without myelopathy or radiculopathy, lumbar region: Secondary | ICD-10-CM | POA: Diagnosis present

## 2016-04-16 DIAGNOSIS — M4316 Spondylolisthesis, lumbar region: Secondary | ICD-10-CM

## 2016-04-16 DIAGNOSIS — D649 Anemia, unspecified: Secondary | ICD-10-CM

## 2016-04-16 LAB — CBC
HCT: 24.5 % — ABNORMAL LOW (ref 36.0–46.0)
HEMATOCRIT: 25.4 % — AB (ref 36.0–46.0)
HEMOGLOBIN: 8.2 g/dL — AB (ref 12.0–15.0)
Hemoglobin: 7.8 g/dL — ABNORMAL LOW (ref 12.0–15.0)
MCH: 28.6 pg (ref 26.0–34.0)
MCH: 28.4 pg (ref 26.0–34.0)
MCHC: 32.3 g/dL (ref 30.0–36.0)
MCHC: 31.8 g/dL (ref 30.0–36.0)
MCV: 88.5 fL (ref 78.0–100.0)
MCV: 89.1 fL (ref 78.0–100.0)
PLATELETS: 265 10*3/uL (ref 150–400)
Platelets: 260 10*3/uL (ref 150–400)
RBC: 2.87 MIL/uL — ABNORMAL LOW (ref 3.87–5.11)
RBC: 2.75 MIL/uL — ABNORMAL LOW (ref 3.87–5.11)
RDW: 16.6 % — ABNORMAL HIGH (ref 11.5–15.5)
RDW: 16.4 % — AB (ref 11.5–15.5)
WBC: 9.4 10*3/uL (ref 4.0–10.5)
WBC: 10.5 10*3/uL (ref 4.0–10.5)

## 2016-04-16 LAB — BASIC METABOLIC PANEL WITH GFR
Anion gap: 9 (ref 5–15)
BUN: 63 mg/dL — ABNORMAL HIGH (ref 6–20)
CO2: 23 mmol/L (ref 22–32)
Calcium: 8.3 mg/dL — ABNORMAL LOW (ref 8.9–10.3)
Chloride: 101 mmol/L (ref 101–111)
Creatinine, Ser: 3.31 mg/dL — ABNORMAL HIGH (ref 0.44–1.00)
GFR calc Af Amer: 14 mL/min — ABNORMAL LOW
GFR calc non Af Amer: 12 mL/min — ABNORMAL LOW
Glucose, Bld: 118 mg/dL — ABNORMAL HIGH (ref 65–99)
Potassium: 4.4 mmol/L (ref 3.5–5.1)
Sodium: 133 mmol/L — ABNORMAL LOW (ref 135–145)

## 2016-04-16 LAB — PREPARE RBC (CROSSMATCH)

## 2016-04-16 LAB — GLUCOSE, CAPILLARY: GLUCOSE-CAPILLARY: 101 mg/dL — AB (ref 65–99)

## 2016-04-16 SURGERY — POSTERIOR LUMBAR FUSION 1 LEVEL
Anesthesia: General | Site: Back

## 2016-04-16 MED ORDER — VANCOMYCIN HCL 1000 MG IV SOLR
INTRAVENOUS | Status: AC
  Filled 2016-04-16: qty 1000

## 2016-04-16 MED ORDER — ROCURONIUM BROMIDE 100 MG/10ML IV SOLN
INTRAVENOUS | Status: DC | PRN
  Administered 2016-04-16: 50 mg via INTRAVENOUS

## 2016-04-16 MED ORDER — HEMOSTATIC AGENTS (NO CHARGE) OPTIME
TOPICAL | Status: DC | PRN
  Administered 2016-04-16: 1 via TOPICAL

## 2016-04-16 MED ORDER — FENTANYL CITRATE (PF) 100 MCG/2ML IJ SOLN
INTRAMUSCULAR | Status: AC
  Filled 2016-04-16: qty 2

## 2016-04-16 MED ORDER — PROPOFOL 10 MG/ML IV BOLUS
INTRAVENOUS | Status: AC
Start: 2016-04-16 — End: 2016-04-16
  Filled 2016-04-16: qty 20

## 2016-04-16 MED ORDER — METOCLOPRAMIDE HCL 5 MG/ML IJ SOLN: 10.0000 mg | Freq: Once | INTRAMUSCULAR | Status: DC | PRN

## 2016-04-16 MED ORDER — CEFAZOLIN SODIUM-DEXTROSE 2-4 GM/100ML-% IV SOLN
INTRAVENOUS | Status: AC
  Administered 2016-04-16: 2 g via INTRAVENOUS
  Filled 2016-04-16: qty 100

## 2016-04-16 MED ORDER — ARTIFICIAL TEARS OP OINT
TOPICAL_OINTMENT | OPHTHALMIC | Status: AC
Start: 2016-04-16 — End: 2016-04-16
  Filled 2016-04-16: qty 3.5

## 2016-04-16 MED ORDER — LIDOCAINE 2% (20 MG/ML) 5 ML SYRINGE
INTRAMUSCULAR | Status: AC
  Filled 2016-04-16: qty 5

## 2016-04-16 MED ORDER — ALBUMIN HUMAN 5 % IV SOLN
INTRAVENOUS | Status: DC | PRN
  Administered 2016-04-16: 15:00:00 via INTRAVENOUS

## 2016-04-16 MED ORDER — LIDOCAINE 2% (20 MG/ML) 5 ML SYRINGE
INTRAMUSCULAR | Status: DC | PRN
  Administered 2016-04-16: 100 mg via INTRAVENOUS

## 2016-04-16 MED ORDER — THROMBIN 5000 UNITS EX SOLR
CUTANEOUS | Status: DC | PRN
  Administered 2016-04-16 (×2): 5000 [IU] via TOPICAL

## 2016-04-16 MED ORDER — VANCOMYCIN HCL 1000 MG IV SOLR
INTRAVENOUS | Status: DC | PRN
  Administered 2016-04-16: 1000 mg via TOPICAL

## 2016-04-16 MED ORDER — ROCURONIUM BROMIDE 50 MG/5ML IV SOLN
INTRAVENOUS | Status: AC
Start: 2016-04-16 — End: 2016-04-16
  Filled 2016-04-16: qty 1

## 2016-04-16 MED ORDER — GABAPENTIN 100 MG PO CAPS
100.0000 mg | ORAL_CAPSULE | Freq: Every day | ORAL | Status: DC
  Administered 2016-04-17 – 2016-04-18 (×2): 100 mg via ORAL
  Filled 2016-04-16 (×3): qty 1

## 2016-04-16 MED ORDER — SODIUM CHLORIDE 0.9 % IV SOLN
Freq: Once | INTRAVENOUS | Status: AC
  Administered 2016-04-16: 19:00:00 via INTRAVENOUS

## 2016-04-16 MED ORDER — 0.9 % SODIUM CHLORIDE (POUR BTL) OPTIME
TOPICAL | Status: DC | PRN
  Administered 2016-04-16: 1000 mL

## 2016-04-16 MED ORDER — SUGAMMADEX SODIUM 200 MG/2ML IV SOLN
INTRAVENOUS | Status: AC
  Filled 2016-04-16: qty 2

## 2016-04-16 MED ORDER — FENTANYL CITRATE (PF) 100 MCG/2ML IJ SOLN
25.0000 ug | INTRAMUSCULAR | Status: DC | PRN
  Administered 2016-04-16: 50 ug via INTRAVENOUS

## 2016-04-16 MED ORDER — FENTANYL CITRATE (PF) 250 MCG/5ML IJ SOLN
INTRAMUSCULAR | Status: AC
  Filled 2016-04-16: qty 5

## 2016-04-16 MED ORDER — ONDANSETRON HCL 4 MG/2ML IJ SOLN
INTRAMUSCULAR | Status: AC
Start: 2016-04-16 — End: 2016-04-16
  Filled 2016-04-16: qty 2

## 2016-04-16 MED ORDER — MEPERIDINE HCL 25 MG/ML IJ SOLN: 6.2500 mg | INTRAMUSCULAR | Status: DC | PRN

## 2016-04-16 MED ORDER — PROPOFOL 10 MG/ML IV BOLUS
INTRAVENOUS | Status: DC | PRN
  Administered 2016-04-16: 90 mg via INTRAVENOUS

## 2016-04-16 MED ORDER — SODIUM CHLORIDE 0.9 % IR SOLN
Status: DC | PRN
  Administered 2016-04-16: 500 mL

## 2016-04-16 MED ORDER — THROMBIN 5000 UNITS EX SOLR
OROMUCOSAL | Status: DC | PRN
  Administered 2016-04-16: 16:00:00 via TOPICAL

## 2016-04-16 MED ORDER — LACTATED RINGERS IV SOLN
INTRAVENOUS | Status: DC | PRN
Start: 2016-04-16 — End: 2016-04-16
  Administered 2016-04-16: 14:00:00 via INTRAVENOUS

## 2016-04-16 MED ORDER — SUGAMMADEX SODIUM 200 MG/2ML IV SOLN
INTRAVENOUS | Status: DC | PRN
  Administered 2016-04-16: 200 mg via INTRAVENOUS

## 2016-04-16 MED ORDER — FENTANYL CITRATE (PF) 250 MCG/5ML IJ SOLN
INTRAMUSCULAR | Status: DC | PRN
  Administered 2016-04-16: 100 ug via INTRAVENOUS

## 2016-04-16 MED ORDER — ONDANSETRON HCL 4 MG/2ML IJ SOLN
INTRAMUSCULAR | Status: DC | PRN
  Administered 2016-04-16: 4 mg via INTRAVENOUS

## 2016-04-16 SURGICAL SUPPLY — 74 items
APL SKNCLS STERI-STRIP NONHPOA (GAUZE/BANDAGES/DRESSINGS) ×1
BAG DECANTER FOR FLEXI CONT (MISCELLANEOUS) ×3 IMPLANT
BENZOIN TINCTURE PRP APPL 2/3 (GAUZE/BANDAGES/DRESSINGS) ×3 IMPLANT
BLADE CLIPPER SURG (BLADE) IMPLANT
BLADE SURG 11 STRL SS (BLADE) ×1 IMPLANT
BRUSH SCRUB EZ PLAIN DRY (MISCELLANEOUS) ×3 IMPLANT
BUR MATCHSTICK NEURO 3.0 LAGG (BURR) ×3 IMPLANT
BUR PRECISION FLUTE 6.0 (BURR) ×1 IMPLANT
CANISTER SUCT 3000ML PPV (MISCELLANEOUS) ×3 IMPLANT
CAP LOCKING THREADED (Cap) ×4 IMPLANT
CLOSURE WOUND 1/2 X4 (GAUZE/BANDAGES/DRESSINGS) ×1
CONT SPEC 4OZ CLIKSEAL STRL BL (MISCELLANEOUS) ×1 IMPLANT
COVER BACK TABLE 60X90IN (DRAPES) ×1 IMPLANT
CROSSLINK SPINAL 38-50MM (Neuro Prosthesis/Implant) ×2 IMPLANT
DECANTER SPIKE VIAL GLASS SM (MISCELLANEOUS) ×3 IMPLANT
DRAPE C-ARM 42X72 X-RAY (DRAPES) ×7 IMPLANT
DRAPE C-ARMOR (DRAPES) ×1 IMPLANT
DRAPE LAPAROTOMY 100X72X124 (DRAPES) ×3 IMPLANT
DRAPE POUCH INSTRU U-SHP 10X18 (DRAPES) ×3 IMPLANT
DRAPE PROXIMA HALF (DRAPES) IMPLANT
DRAPE SURG 17X23 STRL (DRAPES) ×3 IMPLANT
DRSG OPSITE 4X5.5 SM (GAUZE/BANDAGES/DRESSINGS) ×2 IMPLANT
DRSG OPSITE POSTOP 4X6 (GAUZE/BANDAGES/DRESSINGS) ×2 IMPLANT
DURAPREP 26ML APPLICATOR (WOUND CARE) ×3 IMPLANT
ELECT REM PT RETURN 9FT ADLT (ELECTROSURGICAL) ×3
ELECTRODE REM PT RTRN 9FT ADLT (ELECTROSURGICAL) ×1 IMPLANT
EVACUATOR 3/16  PVC DRAIN (DRAIN) ×2
EVACUATOR 3/16 PVC DRAIN (DRAIN) ×1 IMPLANT
GAUZE SPONGE 4X4 12PLY STRL (GAUZE/BANDAGES/DRESSINGS) ×3 IMPLANT
GAUZE SPONGE 4X4 16PLY XRAY LF (GAUZE/BANDAGES/DRESSINGS) IMPLANT
GLOVE BIO SURGEON STRL SZ8 (GLOVE) ×4 IMPLANT
GLOVE BIOGEL PI IND STRL 7.0 (GLOVE) IMPLANT
GLOVE BIOGEL PI IND STRL 7.5 (GLOVE) IMPLANT
GLOVE BIOGEL PI IND STRL 8.5 (GLOVE) IMPLANT
GLOVE BIOGEL PI INDICATOR 7.0 (GLOVE) ×4
GLOVE BIOGEL PI INDICATOR 7.5 (GLOVE) ×2
GLOVE BIOGEL PI INDICATOR 8.5 (GLOVE) ×2
GLOVE ECLIPSE 7.5 STRL STRAW (GLOVE) IMPLANT
GLOVE ECLIPSE 8.5 STRL (GLOVE) ×2 IMPLANT
GLOVE EXAM NITRILE LRG STRL (GLOVE) IMPLANT
GLOVE EXAM NITRILE MD LF STRL (GLOVE) IMPLANT
GLOVE EXAM NITRILE XL STR (GLOVE) IMPLANT
GLOVE EXAM NITRILE XS STR PU (GLOVE) IMPLANT
GLOVE INDICATOR 8.5 STRL (GLOVE) ×4 IMPLANT
GLOVE SURG SS PI 6.5 STRL IVOR (GLOVE) ×4 IMPLANT
GLOVE SURG SS PI 7.0 STRL IVOR (GLOVE) ×4 IMPLANT
GOWN STRL REUS W/ TWL LRG LVL3 (GOWN DISPOSABLE) IMPLANT
GOWN STRL REUS W/ TWL XL LVL3 (GOWN DISPOSABLE) ×2 IMPLANT
GOWN STRL REUS W/TWL 2XL LVL3 (GOWN DISPOSABLE) ×2 IMPLANT
GOWN STRL REUS W/TWL LRG LVL3 (GOWN DISPOSABLE) ×3
GOWN STRL REUS W/TWL XL LVL3 (GOWN DISPOSABLE) ×6
HEMOSTAT POWDER KIT SURGIFOAM (HEMOSTASIS) ×2 IMPLANT
KIT BASIN OR (CUSTOM PROCEDURE TRAY) ×3 IMPLANT
KIT ROOM TURNOVER OR (KITS) ×3 IMPLANT
LIQUID BAND (GAUZE/BANDAGES/DRESSINGS) ×3 IMPLANT
NDL HYPO 25X1 1.5 SAFETY (NEEDLE) ×1 IMPLANT
NEEDLE HYPO 25X1 1.5 SAFETY (NEEDLE) ×3 IMPLANT
NS IRRIG 1000ML POUR BTL (IV SOLUTION) ×3 IMPLANT
PACK LAMINECTOMY NEURO (CUSTOM PROCEDURE TRAY) ×3 IMPLANT
PAD ARMBOARD 7.5X6 YLW CONV (MISCELLANEOUS) ×9 IMPLANT
SCREW CREO MOD AMP 5.5X45MM (Screw) ×2 IMPLANT
SPONGE LAP 4X18 X RAY DECT (DISPOSABLE) IMPLANT
SPONGE SURGIFOAM ABS GEL 100 (HEMOSTASIS) ×1 IMPLANT
SPONGE SURGIFOAM ABS GEL SZ50 (HEMOSTASIS) ×2 IMPLANT
STRIP CLOSURE SKIN 1/2X4 (GAUZE/BANDAGES/DRESSINGS) ×3 IMPLANT
SUT VIC AB 0 CT1 18XCR BRD8 (SUTURE) ×2 IMPLANT
SUT VIC AB 0 CT1 8-18 (SUTURE) ×3
SUT VIC AB 2-0 CT1 18 (SUTURE) ×3 IMPLANT
SUT VIC AB 4-0 PS2 27 (SUTURE) ×3 IMPLANT
TOWEL OR 17X24 6PK STRL BLUE (TOWEL DISPOSABLE) ×3 IMPLANT
TOWEL OR 17X26 10 PK STRL BLUE (TOWEL DISPOSABLE) ×3 IMPLANT
TRAY FOLEY W/METER SILVER 16FR (SET/KITS/TRAYS/PACK) ×1 IMPLANT
TULIP CREP AMP 5.5MM (Orthopedic Implant) ×2 IMPLANT
WATER STERILE IRR 1000ML POUR (IV SOLUTION) ×3 IMPLANT

## 2016-04-16 NOTE — Progress Notes (Signed)
PROGRESS NOTE                                                                                                                                                                                                             Patient Demographics:    Stacie Roman, is a 82 y.o. female, DOB - November 20, 1930, [id number]  Admit date - 04/09/2016   Admitting Physician Kary Kos, MD  Outpatient Primary MD for the patient is [name], MD  LOS - 7   No chief complaint on file.      Brief Narrative  82 y.o. female with past medical history significant for CHF with cardiac pacemaker, CVA, HTN, hypothyroidism, GERD, frequent nosebleeds, CJD, COPD who presented to [facility] on 04/09/2016 for elective L4-L5 fusion , Hospitalist service consulted for post operative anemia, and worsening renal function.   Subjective:    Stacie Roman c/o pain is not well controlled, she is awaiting for surgery today, .daughter in room,     Assessment  & Plan :    Active Problems:   Spondylolisthesis at [id number] level   Diastolic CHF (HCC)   Normocytic anemia   AKI (acute kidney injury) ([place])   CKD (chronic kidney disease) stage 4, GFR 15-29 ml/min (HCC)   Weakness   Anemia - Hgb 5.9, baseline 10. Likely from combination of operative loss, Worsening renal function, and GI. Normocytic. Per family, this has happened w/ previous surgical procedures - FOBT pending - Transfused 2 units PRBC 6/12, hemoglobin improved, today is 8.7, monitor closely and transfuse as needed - iron studies and anemia panel not likely beneficial at this point as PRBC already started.  - hold PO iron, consider IV iron if needed  Acute on chronic kidney disease stage III - Baseline creatinine 2.5, peaked at 4.3, most likely due to prerenal giving anemia and diuretic use - Will give gentle hydration today giving known history of diastolic CHF, and will hold torsemide, difficult function at  baseline tomorrow, resume torsemide at a lower dose - Check BMP in a.m.  Chronic diastolic CHF - Vehicle showing EF [id number], grade 2 diastolic dysfunction. - Currently appears to be dry, continue with gentle hydration today, her renal function improved to resume on torsemide tomorrow  HTN - Continue with home medication,   Hypothyroidism - Continue with levothyroxine  COPD -  Continue with nebs as needed  Pain control: continue current regimen, add neurontin   Lab Results  Component Value Date   PLT 260 04/16/2016    Antibiotics  :    Anti-infectives    Start     Dose/Rate Route Frequency Ordered Stop   04/09/16 2100  ceFAZolin (ANCEF) IVPB 1 g/50 mL premix     1 g 100 mL/hr over 30 Minutes Intravenous Every 12 hours 04/09/16 1539 04/11/16 1029   04/09/16 1539  acyclovir (ZOVIRAX) tablet 800 mg  Status:  Discontinued     800 mg Oral Every 8 hours PRN 04/09/16 1539 04/15/16 1359   04/09/16 1154  vancomycin (VANCOCIN) powder  Status:  Discontinued       As needed 04/09/16 1154 04/09/16 1231   04/09/16 1149  vancomycin (VANCOCIN) 1000 MG powder    Comments:  Atilano Median   : cabinet override      04/09/16 1149 04/09/16 2359   04/09/16 1005  50,000 units bacitracin in 0.9% normal saline 250 mL irrigation  Status:  Discontinued       As needed 04/09/16 1005 04/09/16 1231   04/09/16 0600  ceFAZolin (ANCEF) IVPB 2g/100 mL premix     2 g 200 mL/hr over 30 Minutes Intravenous To [facility] 04/08/16 1239 04/09/16 0855        Objective:   Filed Vitals:   04/15/16 2132 04/16/16 0240 04/16/16 0500 04/16/16 0547  BP: 165/45 160/49  164/48  Pulse: 67 74  64  Temp: 98.9 F (37.2 C) 98.4 F (36.9 C)  98.4 F (36.9 C)  TempSrc: Oral Oral  Oral  Resp: 16 18  18   Height:      Weight:   96.163 kg (212 lb)   SpO2: 96% 95%  97%    Wt Readings from Last 3 Encounters:  04/16/16 96.163 kg (212 lb)  04/02/16 87.573 kg (193 lb 1 oz)     Intake/Output Summary (Last 24  hours) at 04/16/16 0816 Last data filed at 04/16/16 0500  Gross per 24 hour  Intake    400 ml  Output    450 ml  Net    -50 ml     Physical Exam  Awake Alert, Oriented X 3,  [place].AT,PERRAL Supple Neck,No JVD, No cervical lymphadenopathy appriciated.  Symmetrical Chest wall movement, Good air movement bilaterally, CTAB RRR,No Gallops,Rubs or new Murmurs, No Parasternal Heave +ve B.Sounds, Abd Soft, No tenderness,  No Cyanosis, Clubbing or edema, No new Rash or bruise      Data Review:    CBC  Recent Labs Lab 04/14/16 0941 04/14/16 2052 04/15/16 0632 04/16/16 0522  WBC 12.3* 11.6* 10.7* 9.4  HGB 5.9* 8.2* 8.7* 8.2*  HCT 18.1* 25.0* 26.4* 25.4*  PLT 239 229 238 260  MCV 94.8 87.4 87.4 88.5  MCH 30.9 28.7 28.8 28.6  MCHC 32.6 32.8 33.0 32.3  RDW 13.1 15.8* 16.7* 16.6*    Chemistries   Recent Labs Lab 04/14/16 0941 04/15/16 0632 04/16/16 0522  NA 131* 135 133*  K 3.7 4.0 4.4  CL 98* 99* 101  CO2 20* 22 23  GLUCOSE 171* 110* 118*  BUN 61* 62* 63*  CREATININE 4.36* 3.63* 3.31*  CALCIUM 8.0* 8.4* 8.3*  AST 12*  --   --   ALT <5*  --   --   ALKPHOS 73  --   --   BILITOT 0.5  --   --    ------------------------------------------------------------------------------------------------------------------ No results for input(s):  CHOL, HDL, LDLCALC, TRIG, CHOLHDL, LDLDIRECT in the last 72 hours.  No results found for: HGBA1C ------------------------------------------------------------------------------------------------------------------ No results for input(s): TSH, T4TOTAL, T3FREE, THYROIDAB in the last 72 hours.  Invalid input(s): FREET3 ------------------------------------------------------------------------------------------------------------------ No results for input(s): VITAMINB12, FOLATE, FERRITIN, TIBC, IRON, RETICCTPCT in the last 72 hours.  Coagulation profile No results for input(s): INR, PROTIME in the last 168 hours.  No results for input(s):  DDIMER in the last 72 hours.  Cardiac Enzymes No results for input(s): CKMB, TROPONINI, MYOGLOBIN in the last 168 hours.  Invalid input(s): CK ------------------------------------------------------------------------------------------------------------------ No results found for: BNP  Inpatient Medications  Scheduled Meds: . acetaminophen  1,000 mg Oral BH-q7a  . aspirin EC  81 mg Oral BH-q7a  . bisacodyl  10 mg Oral QHS  . bisacodyl  5 mg Oral Daily  . bupivacaine liposome  20 mL Infiltration Once  . cholecalciferol  5,000 Units Oral BH-q7a  . cloNIDine  0.1 mg Oral BID  . docusate sodium  200 mg Oral BID  . isosorbide mononitrate  90 mg Oral q1800  . labetalol  400 mg Oral TID  . levothyroxine  50 mcg Oral QAC breakfast  . loratadine  10 mg Oral BH-q7a  . mometasone-formoterol  2 puff Inhalation BID  . NIFEdipine  60 mg Oral BID  . oxybutynin  10 mg Oral q1800  . polyethylene glycol  17 g Oral Daily   Continuous Infusions:   PRN Meds:.acetaminophen **OR** acetaminophen, acetaminophen-codeine, albuterol, bisacodyl, cyclobenzaprine, diphenhydrAMINE **OR** diphenhydrAMINE, HYDROmorphone (DILAUDID) injection, menthol-cetylpyridinium **OR** phenol, ondansetron (ZOFRAN) IV, senna-docusate  Micro Results No results found for this or any previous visit (from the past 240 hour(s)).  Radiology Reports Dg Lumbar Spine 2-3 Views  04/09/2016  CLINICAL DATA:  Post L3-L4 fusion EXAM: LUMBAR SPINE - 2-3 VIEW; DG C-ARM 61-120 MIN COMPARISON:  Lumbar spine CT 10/11/2015 FINDINGS: Posterior transpedicular screws noted I assume at L3-L4 level. There is a disc spacer I assume L3 L4 level. The previous metallic screw at L5 level is not identified. There is anatomic alignment. Please note the lower lumbar spine is not visualized therefore numbering levels is very difficult. IMPRESSION: Posterior transpedicular screws noted I assume at L3-L4 level. There is a disc spacer I assume L3 L4 level. The  previous metallic screw at L5 level is not identified. There is anatomic alignment. Please note the lower lumbar spine is not visualized therefore numbering levels is very difficult. Fluoroscopy time was 29 seconds.  Please see the operative report. These results were called by telephone at the time of interpretation on 04/09/2016 at 12:49 pm to Dr. Kary Kos , who verbally acknowledged these results. Electronically Signed   By: Lahoma Crocker M.D.   On: 04/09/2016 12:50   Ct Lumbar Spine Wo Contrast  04/13/2016  ADDENDUM REPORT: 04/13/2016 13:32 ADDENDUM: The original report was by Dr. Van Clines. The following addendum is by Dr. Van Clines: These results were called by telephone at the time of interpretation on 04/13/2016 at 1:28 pm to Dr. Ashok Pall , who verbally acknowledged these results. Electronically Signed   By: Van Clines M.D.   On: 04/13/2016 13:32  04/13/2016  CLINICAL DATA:  Hardware placement. Weakness. Back pain common discomfort. Recent L3-4 fusion 04/09/2016 EXAM: CT LUMBAR SPINE WITHOUT CONTRAST TECHNIQUE: Multidetector CT imaging of the lumbar spine was performed without intravenous contrast administration. Multiplanar CT image reconstructions were also generated. COMPARISON:  Multiple exams, including 04/09/2016 and 10/11/2015 FINDINGS: Partially sacralized L5 vertebra noted. This numbering is  consistent with the recent prior CT of 10/11/2015. Compared to 10/11/2015, the L4-5 posterolateral rod and pedicle screw fixation has been removed and there has been placement of posterolateral rod and pedicle screw fixators at L3-4. Interbody spacer noted at L3-4. There was previously 6 mm anterolisthesis at L3-4 and this has not changed. The right L3 pedicle screw skirts the superolateral border of the pedicle and tracks just lateral to the cortical margin of the L3 vertebral body in the paraspinal tissues. The right L2 nerve tracks just above and lateral to the threading of the  right L3 pedicle screw. Solid interbody fusion at L4-5. Posterior decompression from L2-3 through L4-5. Small right pleural effusion with mild bibasilar atelectasis. Aortoiliac atherosclerotic vascular disease. Expected findings from recent operative intervention along the posterior subcutaneous tissues. No visible impingement at the L3-4 or L4-5 level, despite the intervertebral spurring in the right neural foramen at L4-5. No other impingement identified. IMPRESSION: 1. The right L3 pedicle screw captures the superolateral margin of the pedicle and abuts the lateral cortical margin of the vertebral body. The right L2 nerve skirts along the superior and lateral margin of the threatening of the screw, and could be conceivably irritated by the screw. Otherwise expected postoperative appearance. 2. Please note that as on prior exams, the L5 vertebral level is sacralized/transitional. Careful correlation with this numbering strategy, which is stable compared to the prior CT scan, is recommended. Radiology assistant personnel have been notified to put me in telephone contact with the referring physician or the referring physician's clinical representative in order to discuss these findings. Once this communication is established I will issue an addendum to this report for documentation purposes. Electronically Signed: By: Van Clines M.D. On: 04/13/2016 12:52   Dg Chest Port 1 View  04/14/2016  CLINICAL DATA:  Status post lumbar surgery 5 days ago. Hypertension. Congestive heart failure. EXAM: PORTABLE CHEST 1 VIEW COMPARISON:  PA and lateral chest 06/16/2015. FINDINGS: There is cardiomegaly without edema. The lungs are clear. No pneumothorax or pleural effusion. Pacing device is noted. IMPRESSION: No acute disease. Electronically Signed   By: Inge Rise M.D.   On: 04/14/2016 14:16   Dg Abd 2 Views  04/12/2016  CLINICAL DATA:  Ileus. EXAM: ABDOMEN - 2 VIEW COMPARISON:  None. FINDINGS: Bowel gas  pattern is nonobstructive. No convincing evidence of ileus. Gas seen to the level of the rectum. A left-sided drain is seen. No other acute abnormalities. Pedicle rods and screws overlie the lower lumbar spine. IMPRESSION: No definite ileus.  No acute abnormalities. Electronically Signed   By: Dorise Bullion III M.D   On: 04/12/2016 11:54   Dg C-arm 1-60 Min  04/09/2016  CLINICAL DATA:  Post L3-L4 fusion EXAM: LUMBAR SPINE - 2-3 VIEW; DG C-ARM 61-120 MIN COMPARISON:  Lumbar spine CT 10/11/2015 FINDINGS: Posterior transpedicular screws noted I assume at L3-L4 level. There is a disc spacer I assume L3 L4 level. The previous metallic screw at L5 level is not identified. There is anatomic alignment. Please note the lower lumbar spine is not visualized therefore numbering levels is very difficult. IMPRESSION: Posterior transpedicular screws noted I assume at L3-L4 level. There is a disc spacer I assume L3 L4 level. The previous metallic screw at L5 level is not identified. There is anatomic alignment. Please note the lower lumbar spine is not visualized therefore numbering levels is very difficult. Fluoroscopy time was 29 seconds.  Please see the operative report. These results were called by telephone at  the time of interpretation on 04/09/2016 at 12:49 pm to Dr. Kary Kos , who verbally acknowledged these results. Electronically Signed   By: Lahoma Crocker M.D.   On: 04/09/2016 12:50    Time Spent in minutes  25 minutes   Holly Iannaccone M.D PhD on 04/16/2016 at 8:16 AM  Between 7am to 7pm - Pager - [phone]  After 7pm go to [email or web address] - password [facility]  [facility] -  Office  [phone]

## 2016-04-16 NOTE — Progress Notes (Signed)
Patient was NPO, would have to give dilaudid for pain; daughter says she is allergic to morphine....Marland Kitchentried to educate daughter on the difference between medications but she was ada mit that she not receive it.  Called Dr. Annette Stable, on call, and said its ok to give meds with sip of water.

## 2016-04-16 NOTE — Anesthesia Procedure Notes (Signed)
Procedure Name: Intubation Date/Time: 04/16/2016 2:23 PM Performed by: Trixie Deis A Pre-anesthesia Checklist: Patient identified, Timeout performed, Emergency Drugs available, Suction available and Patient being monitored Patient Re-evaluated:Patient Re-evaluated prior to inductionOxygen Delivery Method: Circle system utilized Preoxygenation: Pre-oxygenation with 100% oxygen Intubation Type: IV induction Ventilation: Mask ventilation without difficulty Laryngoscope Size: Mac and 3 Grade View: Grade I Tube type: Oral Tube size: 7.0 mm Number of attempts: 1 Airway Equipment and Method: Stylet Placement Confirmation: ETT inserted through vocal cords under direct vision,  breath sounds checked- equal and bilateral and positive ETCO2 Secured at: 21 cm Tube secured with: Tape Dental Injury: Teeth and Oropharynx as per pre-operative assessment

## 2016-04-16 NOTE — Progress Notes (Signed)
Patient ID: Stacie Roman, female   DOB: Jan 26, 1931, 82 y.o.   MRN: [id number]   Patient is doing okay today still a lot of back pain improved right looks to be function following transfusion   Strength 4 minus out of 5 quadriceps iliopsoas on the right   Patient presents for reexploration of fusion removal of hardware and repositioning of right L3 pedicle screw. I've extensively gone over the risks and benefits of the operation with the patient as well as perioperative course expectations of outcome and alternatives of surgery and she understood and agreed to proceed forward.

## 2016-04-16 NOTE — Op Note (Signed)
Preoperative diagnosis: Malpositioned right L3 pedicle screw  Postoperative diagnosis: Same  Procedure: Reexploration of fusion removal of hardware on the right at L3 with placement of right L2 pedicle screw and nonsegmental fixation L2-L4 and the right  Surgeon: Dominica Severin Ladarius Seubert  Asst.: Kristeen Miss  Anesthesia: Gen.  EBL: Minimal  History of present illness: Patient is a very pleasant 82 year old female who underwent L3-4 posterior lumbar interbody fusion a week ago and initially did fairly well however as she started mobilize start stretching worsening right leg pain and weakness also was very anemic workup showed a hemoglobin of 5.9 and showed a CT scan showed a right L3 pedicle screw lateral to the vertebral body. Patient was resuscitated and transfused and when she was stabilized she was taken back recommended back to the operating room for revision and replacement of right L3 pedicle screw. I extensively went over the risks and benefits of the operation the patient as well as perioperative course expectations of outcome and alternatives of surgery and she understood and agreed to proceed forward.  Operative procedure: Patient brought into the or was induced under general anesthesia positioned prone the Wilson frame her back was prepped and draped in routine sterile fashion her old incision was opened up on this and exposed of the fusion construct and the hardware bilaterally the left-sided construct was solid the right-sided construct was disconnected the right L3 screw was loose and this was removed I made 2 attempts to try the direct a screw more medially to the right L3 pedicle both breeched the medial aspect pedicle screw at this point I felt there wasn't enough competent pedicle to place a screw this I placed a screw at L2 using accommodation of AP and lateral fluoroscopy. Then after adequate position of been achieved I tightened upper rod anchored in the top tightening nuts and placed a  cross-link. I explored the epidural space and sure there were no stenosis on the right L3 and L4 nerves at the side the previous decompression and then Cobos irrigated the wound is Michael vancomycin powder in as well as Surgifoam placed all the medium large Hemovac drain and closed the wound in layers with after Vicryl back Meissen pattern he extrafascial space and closed that and subcutaneous subcuticular Vicryl stitch Dermabond benzo and Steri-Strips and sterile dressing were applied patient recovered in stable condition. At the end the case all needle counts sponge counts were correct.

## 2016-04-16 NOTE — Anesthesia Preprocedure Evaluation (Addendum)
Anesthesia Evaluation  Patient identified by MRN, date of birth, ID band Patient awake    Reviewed: Allergy & Precautions, NPO status , Patient's Chart, lab work & pertinent test results  Airway Mallampati: II  TM Distance: >3 FB Neck ROM: Full    Dental no notable dental hx.    Pulmonary asthma , COPD, former smoker,    Pulmonary exam normal breath sounds clear to auscultation       Cardiovascular hypertension, Pt. on medications +CHF  Normal cardiovascular exam+ pacemaker  Rhythm:Regular Rate:Normal     Neuro/Psych CVA, No Residual Symptoms negative neurological ROS  negative psych ROS   GI/Hepatic negative GI ROS, Neg liver ROS,   Endo/Other  negative endocrine ROS  Renal/GU Renal InsufficiencyRenal disease  negative genitourinary   Musculoskeletal negative musculoskeletal ROS (+)   Abdominal   Peds negative pediatric ROS (+)  Hematology  (+) anemia ,   Anesthesia Other Findings   Reproductive/Obstetrics negative OB ROS                            Anesthesia Physical Anesthesia Plan  ASA: III  Anesthesia Plan: General   Post-op Pain Management:    Induction: Intravenous  Airway Management Planned: Oral ETT  Additional Equipment:   Intra-op Plan:   Post-operative Plan: Extubation in OR  Informed Consent: I have reviewed the patients History and Physical, chart, labs and discussed the procedure including the risks, benefits and alternatives for the proposed anesthesia with the patient or authorized representative who has indicated his/her understanding and acceptance.   Dental advisory given  Plan Discussed with: CRNA  Anesthesia Plan Comments:         Anesthesia Quick Evaluation

## 2016-04-16 NOTE — Progress Notes (Signed)
PT Cancellation Note  Patient Details Name: Stacie Roman MRN: [id number] DOB: Sep 28, 1931   Cancelled Treatment:    Reason Eval/Treat Not Completed: Patient at procedure or test/unavailable Pt in OR. Will follow up as time allows.    [name] 04/16/2016, 2:30 PM Wray Kearns, [place], DPT [phone]

## 2016-04-16 NOTE — Anesthesia Postprocedure Evaluation (Signed)
Anesthesia Post Note  Patient: Stacie Roman  Procedure(s) Performed: Procedure(s) (LRB): Exploration of lumbar fusion removal of right lumbar three pedicle screw, replacement of right lumbar two pedicle screw, non segmental lumbar two to lumbar four fixation (N/A)  Patient location during evaluation: PACU Anesthesia Type: General Level of consciousness: awake and alert Pain management: pain level controlled Vital Signs Assessment: post-procedure vital signs reviewed and stable Respiratory status: spontaneous breathing, nonlabored ventilation, respiratory function stable and patient connected to nasal cannula oxygen Cardiovascular status: blood pressure returned to baseline and stable Postop Assessment: no signs of nausea or vomiting Anesthetic complications: no    Last Vitals:  Filed Vitals:   04/16/16 1011 04/16/16 1608  BP: 152/51   Pulse: 62 60  Temp: 37.1 C 36.3 C  Resp: 20 24    Last Pain:  Filed Vitals:   04/16/16 1613  PainSc: 3                  Montez Hageman

## 2016-04-16 NOTE — Anesthesia Postprocedure Evaluation (Signed)
Anesthesia Post Note  Patient: Stacie Roman  Procedure(s) Performed: Procedure(s) (LRB): Exploration of lumbar fusion removal of right lumbar three pedicle screw, replacement of right lumbar two pedicle screw, non segmental lumbar two to lumbar four fixation (N/A)  Patient location during evaluation: PACU Anesthesia Type: General Level of consciousness: awake and alert Pain management: pain level controlled Vital Signs Assessment: post-procedure vital signs reviewed and stable Respiratory status: spontaneous breathing, nonlabored ventilation, respiratory function stable and patient connected to nasal cannula oxygen Cardiovascular status: blood pressure returned to baseline and stable Postop Assessment: no signs of nausea or vomiting Anesthetic complications: no    Last Vitals:  Filed Vitals:   04/16/16 1700 04/16/16 1709  BP:  163/56  Pulse: 59 59  Temp:    Resp: 15 17    Last Pain:  Filed Vitals:   04/16/16 1725  PainSc: Asleep                 Dierre Crevier A

## 2016-04-16 NOTE — Transfer of Care (Signed)
Immediate Anesthesia Transfer of Care Note  Patient: Stacie Roman  Procedure(s) Performed: Procedure(s): Exploration of lumbar fusion removal of right lumbar three pedicle screw, replacement of right lumbar two pedicle screw, non segmental lumbar two to lumbar four fixation (N/A)  Patient Location: PACU  Anesthesia Type:General  Level of Consciousness: sedated  Airway & Oxygen Therapy: Patient Spontanous Breathing and Patient connected to nasal cannula oxygen  Post-op Assessment: Report given to RN and Post -op Vital signs reviewed and stable  Post vital signs: Reviewed and stable  Last Vitals:  Filed Vitals:   04/16/16 1700 04/16/16 1709  BP:  163/56  Pulse: 59 59  Temp:    Resp: 15 17    Last Pain:  Filed Vitals:   04/16/16 1725  PainSc: Asleep      Patients Stated Pain Goal: 0 ([id number])  Complications: No apparent anesthesia complications

## 2016-04-17 DIAGNOSIS — I5032 Chronic diastolic (congestive) heart failure: Secondary | ICD-10-CM

## 2016-04-17 DIAGNOSIS — N184 Chronic kidney disease, stage 4 (severe): Secondary | ICD-10-CM

## 2016-04-17 LAB — BASIC METABOLIC PANEL
Anion gap: 13 (ref 5–15)
BUN: 57 mg/dL — AB (ref 6–20)
CALCIUM: 8.3 mg/dL — AB (ref 8.9–10.3)
CO2: 20 mmol/L — ABNORMAL LOW (ref 22–32)
CREATININE: 2.92 mg/dL — AB (ref 0.44–1.00)
Chloride: 103 mmol/L (ref 101–111)
GFR calc Af Amer: 16 mL/min — ABNORMAL LOW (ref >60)
GFR calc non Af Amer: 14 mL/min — ABNORMAL LOW (ref >60)
Glucose, Bld: 98 mg/dL (ref 65–99)
Potassium: 4.7 mmol/L (ref 3.5–5.1)
SODIUM: 136 mmol/L (ref 135–145)

## 2016-04-17 LAB — TYPE AND SCREEN
ABO/RH(D): AB POS
ANTIBODY SCREEN: NEGATIVE
UNIT DIVISION: 0
Unit division: 0
Unit division: 0

## 2016-04-17 LAB — GLUCOSE, CAPILLARY: Glucose-Capillary: 104 mg/dL — ABNORMAL HIGH (ref 65–99)

## 2016-04-17 LAB — CBC
HCT: 27.4 % — ABNORMAL LOW (ref 36.0–46.0)
HEMOGLOBIN: 9 g/dL — AB (ref 12.0–15.0)
MCH: 28.8 pg (ref 26.0–34.0)
MCHC: 32.8 g/dL (ref 30.0–36.0)
MCV: 87.8 fL (ref 78.0–100.0)
PLATELETS: 248 10*3/uL (ref 150–400)
RBC: 3.12 MIL/uL — AB (ref 3.87–5.11)
RDW: 17 % — ABNORMAL HIGH (ref 11.5–15.5)
WBC: 10.3 10*3/uL (ref 4.0–10.5)

## 2016-04-17 MED ORDER — PHENOL 1.4 % MT LIQD: 1.0000 | OROMUCOSAL | Status: DC | PRN

## 2016-04-17 MED ORDER — CEFAZOLIN SODIUM-DEXTROSE 2-4 GM/100ML-% IV SOLN
2.0000 g | Freq: Two times a day (BID) | INTRAVENOUS | Status: AC
  Administered 2016-04-17 – 2016-04-18 (×4): 2 g via INTRAVENOUS
  Filled 2016-04-17 (×6): qty 100

## 2016-04-17 MED ORDER — MORPHINE SULFATE (PF) 2 MG/ML IV SOLN: 1.0000 mg | INTRAVENOUS | Status: DC | PRN

## 2016-04-17 MED ORDER — SODIUM CHLORIDE 0.9% FLUSH: 3.0000 mL | INTRAVENOUS | Status: DC | PRN

## 2016-04-17 MED ORDER — ONDANSETRON HCL 4 MG/2ML IJ SOLN: 4.0000 mg | INTRAMUSCULAR | Status: DC | PRN

## 2016-04-17 MED ORDER — SODIUM CHLORIDE 0.9% FLUSH
3.0000 mL | Freq: Two times a day (BID) | INTRAVENOUS | Status: DC
Start: 2016-04-17 — End: 2016-04-20
  Administered 2016-04-17 – 2016-04-20 (×6): 3 mL via INTRAVENOUS

## 2016-04-17 MED ORDER — SODIUM CHLORIDE 0.9 % IV SOLN: 250.0000 mL | INTRAVENOUS | Status: DC

## 2016-04-17 MED ORDER — ACETAMINOPHEN 650 MG RE SUPP: 650.0000 mg | RECTAL | Status: DC | PRN

## 2016-04-17 MED ORDER — ACETAMINOPHEN 325 MG PO TABS: 650.0000 mg | ORAL_TABLET | ORAL | Status: DC | PRN

## 2016-04-17 MED ORDER — ACETAMINOPHEN-CODEINE #3 300-30 MG PO TABS
2.0000 | ORAL_TABLET | ORAL | Status: DC | PRN
  Administered 2016-04-17 – 2016-04-18 (×5): 2 via ORAL
  Filled 2016-04-17 (×4): qty 2

## 2016-04-17 MED ORDER — MENTHOL 3 MG MT LOZG: 1.0000 | LOZENGE | OROMUCOSAL | Status: DC | PRN

## 2016-04-17 MED ORDER — IPRATROPIUM-ALBUTEROL 0.5-2.5 (3) MG/3ML IN SOLN
3.0000 mL | Freq: Every day | RESPIRATORY_TRACT | Status: DC
  Administered 2016-04-17 – 2016-04-20 (×4): 3 mL via RESPIRATORY_TRACT
  Filled 2016-04-17 (×5): qty 3

## 2016-04-17 NOTE — Progress Notes (Signed)
Foley to be d/c per order but patient decline for foley to be removed until discuss with surgeon. 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 Daughter at bedside. No other distress voice at this time. Will continue to monitor.   Ave Filter, RN

## 2016-04-17 NOTE — Progress Notes (Addendum)
PROGRESS NOTE                                                                                                                                                                                                             Patient Demographics:    Stacie Roman, is a 82 y.o. female, DOB - 01/07/1931, [id number]  Admit date - 04/09/2016   Admitting Physician Kary Kos, MD  Outpatient Primary MD for the patient is [name], MD  LOS - 8   No chief complaint on file.      Brief Narrative  82 y.o. female with past medical history significant for CHF with cardiac pacemaker, CVA, HTN, hypothyroidism, GERD, frequent nosebleeds, CJD, COPD who presented to [facility] on 04/09/2016 for elective L4-L5 fusion , Hospitalist service consulted for post operative anemia, and worsening renal function.   Subjective:    Stacie Roman post op day one, pain is much better , son in room,     Assessment  & Plan :    Active Problems:   Spondylolisthesis at [id number] level   Diastolic CHF (HCC)   Normocytic anemia   AKI (acute kidney injury) (Media)   CKD (chronic kidney disease) stage 4, GFR 15-29 ml/min (HCC)   Weakness   Spondylosis of lumbar joint   Anemia - Hgb 5.9, baseline 10. Likely from combination of operative loss, Worsening renal function, and GI. Normocytic. Per family, this has happened w/ previous surgical procedures - FOBT pending - Transfused 2 units PRBC 6/12, hemoglobin improved, today is 8.7, monitor closely and transfuse as needed - iron studies and anemia panel not likely beneficial at this point as PRBC already started.  - hold PO iron, consider IV iron if needed  Acute on chronic kidney disease stage III - Baseline creatinine 2.5, peaked at 4.3, most likely due to prerenal giving anemia and diuretic use - Will give gentle hydration today giving known history of diastolic CHF, and will hold torsemide, difficult function at  baseline tomorrow, resume torsemide at a lower dose - cr improving, consider restart home diuretic on [id number]  Chronic diastolic CHF - Vehicle showing EF [id number], grade 2 diastolic dysfunction. - Currently appears to be dry, s/p gentle hydration, her renal function has improved, consider  resume torsemide 6/16  HTN - Continue with home medication,   Hypothyroidism -  Continue with levothyroxine  COPD on home oxygen - Continue with nebs as needed  Pain control: continue current regimen, add neurontin   Lab Results  Component Value Date   PLT 248 04/17/2016    Antibiotics  :    Anti-infectives    Start     Dose/Rate Route Frequency Ordered Stop   04/17/16 0400  ceFAZolin (ANCEF) IVPB 2g/100 mL premix     2 g 200 mL/hr over 30 Minutes Intravenous Every 12 hours 04/17/16 0330 04/19/16 0359   04/16/16 1503  vancomycin (VANCOCIN) powder  Status:  Discontinued       As needed 04/16/16 1503 04/16/16 1600   04/16/16 1502  bacitracin 50,000 Units in sodium chloride irrigation 0.9 % 500 mL irrigation  Status:  Discontinued       As needed 04/16/16 1502 04/16/16 1600   04/16/16 1448  vancomycin (VANCOCIN) 1000 MG powder    Comments:  Key, Jennifer   : cabinet override      04/16/16 1448 04/17/16 0259   04/16/16 1404  ceFAZolin (ANCEF) 2-4 GM/100ML-% IVPB    Comments:  Abbie Sons, Sarah   : cabinet override      04/16/16 1404 04/16/16 1416   04/09/16 2100  ceFAZolin (ANCEF) IVPB 1 g/50 mL premix     1 g 100 mL/hr over 30 Minutes Intravenous Every 12 hours 04/09/16 1539 04/11/16 1029   04/09/16 1539  acyclovir (ZOVIRAX) tablet 800 mg  Status:  Discontinued     800 mg Oral Every 8 hours PRN 04/09/16 1539 04/15/16 1359   04/09/16 1154  vancomycin (VANCOCIN) powder  Status:  Discontinued       As needed 04/09/16 1154 04/09/16 1231   04/09/16 1149  vancomycin (VANCOCIN) 1000 MG powder    Comments:  Atilano Median   : cabinet override      04/09/16 1149 04/09/16 2359   04/09/16 1005  50,000 units  bacitracin in 0.9% normal saline 250 mL irrigation  Status:  Discontinued       As needed 04/09/16 1005 04/09/16 1231   04/09/16 0600  ceFAZolin (ANCEF) IVPB 2g/100 mL premix     2 g 200 mL/hr over 30 Minutes Intravenous To [facility] 04/08/16 1239 04/09/16 0855        Objective:   Filed Vitals:   04/17/16 0535 04/17/16 0900 04/17/16 0938 04/17/16 0947  BP: 138/38  184/66   Pulse: 66  79   Temp: 98.9 F (37.2 C)  98.6 F (37 C)   TempSrc: Oral  Oral   Resp: 18  20   Height:      Weight:      SpO2: 95% 96% 92% 93%    Wt Readings from Last 3 Encounters:  04/17/16 99.338 kg (219 lb)  04/02/16 87.573 kg (193 lb 1 oz)     Intake/Output Summary (Last 24 hours) at 04/17/16 1423 Last data filed at 04/17/16 1030  Gross per 24 hour  Intake   1100 ml  Output   1605 ml  Net   -505 ml     Physical Exam  Awake Alert, Oriented X 3,  [place].AT,PERRAL Supple Neck,No JVD, No cervical lymphadenopathy appriciated.  Symmetrical Chest wall movement, Good air movement bilaterally, CTAB RRR,No Gallops,Rubs or new Murmurs, No Parasternal Heave +ve B.Sounds, Abd Soft, No tenderness,  No Cyanosis, Clubbing or edema, No new Rash or bruise      Data Review:    CBC  Recent Labs Lab 04/14/16 2052 04/15/16 [id number] 04/16/16  0522 04/16/16 1638 04/17/16 0526  WBC 11.6* 10.7* 9.4 10.5 10.3  HGB 8.2* 8.7* 8.2* 7.8* 9.0*  HCT 25.0* 26.4* 25.4* 24.5* 27.4*  PLT 229 238 260 265 248  MCV 87.4 87.4 88.5 89.1 87.8  MCH 28.7 28.8 28.6 28.4 28.8  MCHC 32.8 33.0 32.3 31.8 32.8  RDW 15.8* 16.7* 16.6* 16.4* 17.0*    Chemistries   Recent Labs Lab 04/14/16 0941 04/15/16 0632 04/16/16 0522 04/17/16 0526  NA 131* 135 133* 136  K 3.7 4.0 4.4 4.7  CL 98* 99* 101 103  CO2 20* 22 23 20*  GLUCOSE 171* 110* 118* 98  BUN 61* 62* 63* 57*  CREATININE 4.36* 3.63* 3.31* 2.92*  CALCIUM 8.0* 8.4* 8.3* 8.3*  AST 12*  --   --   --   ALT <5*  --   --   --   ALKPHOS 73  --   --   --   BILITOT  0.5  --   --   --    ------------------------------------------------------------------------------------------------------------------ No results for input(s): CHOL, HDL, LDLCALC, TRIG, CHOLHDL, LDLDIRECT in the last 72 hours.  No results found for: HGBA1C ------------------------------------------------------------------------------------------------------------------ No results for input(s): TSH, T4TOTAL, T3FREE, THYROIDAB in the last 72 hours.  Invalid input(s): FREET3 ------------------------------------------------------------------------------------------------------------------ No results for input(s): VITAMINB12, FOLATE, FERRITIN, TIBC, IRON, RETICCTPCT in the last 72 hours.  Coagulation profile No results for input(s): INR, PROTIME in the last 168 hours.  No results for input(s): DDIMER in the last 72 hours.  Cardiac Enzymes No results for input(s): CKMB, TROPONINI, MYOGLOBIN in the last 168 hours.  Invalid input(s): CK ------------------------------------------------------------------------------------------------------------------ No results found for: BNP  Inpatient Medications  Scheduled Meds: . aspirin EC  81 mg Oral BH-q7a  . bisacodyl  10 mg Oral QHS  . bisacodyl  5 mg Oral Daily  . bupivacaine liposome  20 mL Infiltration Once  .  ceFAZolin (ANCEF) IV  2 g Intravenous Q12H  . cholecalciferol  5,000 Units Oral BH-q7a  . cloNIDine  0.1 mg Oral BID  . docusate sodium  200 mg Oral BID  . gabapentin  100 mg Oral QHS  . isosorbide mononitrate  90 mg Oral q1800  . labetalol  400 mg Oral TID  . levothyroxine  50 mcg Oral QAC breakfast  . loratadine  10 mg Oral BH-q7a  . mometasone-formoterol  2 puff Inhalation BID  . NIFEdipine  60 mg Oral BID  . oxybutynin  10 mg Oral q1800  . polyethylene glycol  17 g Oral Daily  . sodium chloride flush  3 mL Intravenous Q12H   Continuous Infusions: . sodium chloride     PRN Meds:.acetaminophen **OR** acetaminophen,  acetaminophen-codeine, albuterol, bisacodyl, cyclobenzaprine, diphenhydrAMINE **OR** diphenhydrAMINE, HYDROmorphone (DILAUDID) injection, menthol-cetylpyridinium **OR** phenol, morphine injection, ondansetron (ZOFRAN) IV, senna-docusate, sodium chloride flush  Micro Results No results found for this or any previous visit (from the past 240 hour(s)).  Radiology Reports Dg Lumbar Spine 2-3 Views  04/16/2016  CLINICAL DATA:  Lumbar spine hardware revision. EXAM: DG C-ARM 61-120 MIN; LUMBAR SPINE - 2-3 VIEW COMPARISON:  Lumbar spine CT 04/13/2016. FINDINGS: Spot PA and lateral fluoroscopic images of the lumbar spine are submitted from the operating room. Numbering is based on the location of the pre-existing hardware at L3-4. These views demonstrate the placement of a right pedicle screw at L2. There are soft tissue retractors and sponges in the surgical bed. No interconnecting rod has been placed. IMPRESSION: Intraoperative views during extension of the lumbar fusion superiorly  across the L2-3 disc space. Electronically Signed   By: Richardean Sale M.D.   On: 04/16/2016 17:50   Dg Lumbar Spine 2-3 Views  04/09/2016  CLINICAL DATA:  Post L3-L4 fusion EXAM: LUMBAR SPINE - 2-3 VIEW; DG C-ARM 61-120 MIN COMPARISON:  Lumbar spine CT 10/11/2015 FINDINGS: Posterior transpedicular screws noted I assume at L3-L4 level. There is a disc spacer I assume L3 L4 level. The previous metallic screw at L5 level is not identified. There is anatomic alignment. Please note the lower lumbar spine is not visualized therefore numbering levels is very difficult. IMPRESSION: Posterior transpedicular screws noted I assume at L3-L4 level. There is a disc spacer I assume L3 L4 level. The previous metallic screw at L5 level is not identified. There is anatomic alignment. Please note the lower lumbar spine is not visualized therefore numbering levels is very difficult. Fluoroscopy time was 29 seconds.  Please see the operative report. These  results were called by telephone at the time of interpretation on 04/09/2016 at 12:49 pm to Dr. Kary Kos , who verbally acknowledged these results. Electronically Signed   By: Lahoma Crocker M.D.   On: 04/09/2016 12:50   Ct Lumbar Spine Wo Contrast  04/13/2016  ADDENDUM REPORT: 04/13/2016 13:32 ADDENDUM: The original report was by Dr. Van Clines. The following addendum is by Dr. Van Clines: These results were called by telephone at the time of interpretation on 04/13/2016 at 1:28 pm to Dr. Ashok Pall , who verbally acknowledged these results. Electronically Signed   By: Van Clines M.D.   On: 04/13/2016 13:32  04/13/2016  CLINICAL DATA:  Hardware placement. Weakness. Back pain common discomfort. Recent L3-4 fusion 04/09/2016 EXAM: CT LUMBAR SPINE WITHOUT CONTRAST TECHNIQUE: Multidetector CT imaging of the lumbar spine was performed without intravenous contrast administration. Multiplanar CT image reconstructions were also generated. COMPARISON:  Multiple exams, including 04/09/2016 and 10/11/2015 FINDINGS: Partially sacralized L5 vertebra noted. This numbering is consistent with the recent prior CT of 10/11/2015. Compared to 10/11/2015, the L4-5 posterolateral rod and pedicle screw fixation has been removed and there has been placement of posterolateral rod and pedicle screw fixators at L3-4. Interbody spacer noted at L3-4. There was previously 6 mm anterolisthesis at L3-4 and this has not changed. The right L3 pedicle screw skirts the superolateral border of the pedicle and tracks just lateral to the cortical margin of the L3 vertebral body in the paraspinal tissues. The right L2 nerve tracks just above and lateral to the threading of the right L3 pedicle screw. Solid interbody fusion at L4-5. Posterior decompression from L2-3 through L4-5. Small right pleural effusion with mild bibasilar atelectasis. Aortoiliac atherosclerotic vascular disease. Expected findings from recent operative  intervention along the posterior subcutaneous tissues. No visible impingement at the L3-4 or L4-5 level, despite the intervertebral spurring in the right neural foramen at L4-5. No other impingement identified. IMPRESSION: 1. The right L3 pedicle screw captures the superolateral margin of the pedicle and abuts the lateral cortical margin of the vertebral body. The right L2 nerve skirts along the superior and lateral margin of the threatening of the screw, and could be conceivably irritated by the screw. Otherwise expected postoperative appearance. 2. Please note that as on prior exams, the L5 vertebral level is sacralized/transitional. Careful correlation with this numbering strategy, which is stable compared to the prior CT scan, is recommended. Radiology assistant personnel have been notified to put me in telephone contact with the referring physician or the referring physician's clinical representative in  order to discuss these findings. Once this communication is established I will issue an addendum to this report for documentation purposes. Electronically Signed: By: Van Clines M.D. On: 04/13/2016 12:52   Dg Chest Port 1 View  04/14/2016  CLINICAL DATA:  Status post lumbar surgery 5 days ago. Hypertension. Congestive heart failure. EXAM: PORTABLE CHEST 1 VIEW COMPARISON:  PA and lateral chest 06/16/2015. FINDINGS: There is cardiomegaly without edema. The lungs are clear. No pneumothorax or pleural effusion. Pacing device is noted. IMPRESSION: No acute disease. Electronically Signed   By: Inge Rise M.D.   On: 04/14/2016 14:16   Dg Abd 2 Views  04/12/2016  CLINICAL DATA:  Ileus. EXAM: ABDOMEN - 2 VIEW COMPARISON:  None. FINDINGS: Bowel gas pattern is nonobstructive. No convincing evidence of ileus. Gas seen to the level of the rectum. A left-sided drain is seen. No other acute abnormalities. Pedicle rods and screws overlie the lower lumbar spine. IMPRESSION: No definite ileus.  No acute  abnormalities. Electronically Signed   By: Dorise Bullion III M.D   On: 04/12/2016 11:54   Dg C-arm 1-60 Min  04/16/2016  CLINICAL DATA:  Lumbar spine hardware revision. EXAM: DG C-ARM 61-120 MIN; LUMBAR SPINE - 2-3 VIEW COMPARISON:  Lumbar spine CT 04/13/2016. FINDINGS: Spot PA and lateral fluoroscopic images of the lumbar spine are submitted from the operating room. Numbering is based on the location of the pre-existing hardware at L3-4. These views demonstrate the placement of a right pedicle screw at L2. There are soft tissue retractors and sponges in the surgical bed. No interconnecting rod has been placed. IMPRESSION: Intraoperative views during extension of the lumbar fusion superiorly across the L2-3 disc space. Electronically Signed   By: Richardean Sale M.D.   On: 04/16/2016 17:50   Dg C-arm 1-60 Min  04/09/2016  CLINICAL DATA:  Post L3-L4 fusion EXAM: LUMBAR SPINE - 2-3 VIEW; DG C-ARM 61-120 MIN COMPARISON:  Lumbar spine CT 10/11/2015 FINDINGS: Posterior transpedicular screws noted I assume at L3-L4 level. There is a disc spacer I assume L3 L4 level. The previous metallic screw at L5 level is not identified. There is anatomic alignment. Please note the lower lumbar spine is not visualized therefore numbering levels is very difficult. IMPRESSION: Posterior transpedicular screws noted I assume at L3-L4 level. There is a disc spacer I assume L3 L4 level. The previous metallic screw at L5 level is not identified. There is anatomic alignment. Please note the lower lumbar spine is not visualized therefore numbering levels is very difficult. Fluoroscopy time was 29 seconds.  Please see the operative report. These results were called by telephone at the time of interpretation on 04/09/2016 at 12:49 pm to Dr. Kary Kos , who verbally acknowledged these results. Electronically Signed   By: Lahoma Crocker M.D.   On: 04/09/2016 12:50    Time Spent in minutes  25 minutes   Tomicka Lover M.D PhD on 04/17/2016 at 2:23  PM  Between 7am to 7pm - Pager - [phone]  After 7pm go to [email or web address] - password [facility]  [facility] -  Office  [phone]

## 2016-04-17 NOTE — Progress Notes (Signed)
   04/17/16 1440  Clinical Encounter Type  Visited With Patient and family together  Visit Type Initial  Spiritual Encounters  Spiritual Needs Prayer  Stress Factors  Patient Stress Factors Not reviewed  Family Stress Factors Not reviewed  Chaplain visited with patient.  Patient's son was present,  Chaplain, patient  and son shared a nice conversation.  Son and family eager to bring their mother home.  Patient shared she was a great great grandmother and she has raised seven children. Chaplain prayed with patient and son.  Chaplain made aware further services available should they be needed.

## 2016-04-17 NOTE — Progress Notes (Signed)
Physical Therapy Treatment Patient Details Name: Stacie Roman MRN: [id number] DOB: [birth year] Today's Date: 04/17/2016    History of Present Illness pt presents post L3-4 PLIF with hardware removal.  pt with hx of Pacemaker, CHF, CVA, HTN, CA, CKD, COPD, and back surgery.  Pt now s/p Exploration of lumbar fusion removal of right lumbar three pedicle screw, replacement of right lumbar two pedicle screw, non segmental lumbar two to lumbar four fixation 6/14.     PT Comments    Patient reports coughing up phlegm and mucus today and having a hard time breathing due to the secretions. Explained importance of mobility to help mobilize secretions. Requires assist of 2 to get to EOB and for standing/transfers due to weakness and bil knee instability. 3/4 DOE. Sp02 83% on 2L/min 02. Cues for pursed lip breathing and it took <1 min to resolve to >90%. Family now reporting they want to take pt home. Will follow acutely.   Follow Up Recommendations  SNF;Supervision/Assistance - 24 hour (but family reports they want to take her home)     Equipment Recommendations  None recommended by PT    Recommendations for Other Services       Precautions / Restrictions Precautions Precautions: Fall;Back Precaution Booklet Issued: No Precaution Comments: pt unable to recall precuations. Reviewed 3/3 precautions. Required Braces or Orthoses: Spinal Brace Spinal Brace: Lumbar corset;Applied in sitting position Restrictions Weight Bearing Restrictions: No    Mobility  Bed Mobility Overal bed mobility: Needs Assistance Bed Mobility: Rolling;Sidelying to Sit Rolling: Mod assist Sidelying to sit: Mod assist;+2 for physical assistance       General bed mobility comments: cues for safety and correct body positioning. Pt reaching to grab therapist for assist.   Transfers Overall transfer level: Needs assistance Equipment used: Rolling walker (2 wheeled) Transfers: Sit to/from Stand Sit to Stand: Mod  assist;+2 physical assistance Stand pivot transfers: Mod assist;+2 physical assistance       General transfer comment: Assist of 2 to stand from EOB wtih cues for hand placement/technique. Retropulsive upon standing. Better able to pivot feet together except difficulty noted with LLE. SPT bed to chair. Bil knee instability.  Ambulation/Gait                 Stairs            Wheelchair Mobility    Modified Rankin (Stroke Patients Only)       Balance Overall balance assessment: Needs assistance Sitting-balance support: Feet supported;Bilateral upper extremity supported Sitting balance-Leahy Scale: Poor Sitting balance - Comments: Leans to the right and posteriorly with BUEs support. Pt wheezing.   Standing balance support: During functional activity;Bilateral upper extremity supported Standing balance-Leahy Scale: Poor Standing balance comment: Requires BUE support on RW and external support of 2 for standing. Bil knee instability.                     Cognition Arousal/Alertness: Awake/alert Behavior During Therapy: Flat affect;Anxious Overall Cognitive Status: Impaired/Different from baseline Area of Impairment: Safety/judgement     Memory: Decreased recall of precautions   Safety/Judgement: Decreased awareness of safety;Decreased awareness of deficits Awareness: Intellectual Problem Solving: Requires verbal cues;Difficulty sequencing;Decreased initiation;Slow processing      Exercises General Exercises - Lower Extremity Long Arc Quad: Both;10 reps;Seated    General Comments General comments (skin integrity, edema, etc.): Pt's daughter present during session. Pt on 2L/min 02.      Pertinent Vitals/Pain Pain Assessment: 0-10 Pain Score: 8  Pain  Location: back Pain Descriptors / Indicators: Operative site guarding;Sore Pain Intervention(s): Monitored during session;Repositioned;Premedicated before session;Limited activity within patient's  tolerance    Home Living                      Prior Function            PT Goals (current goals can now be found in the care plan section) Progress towards PT goals: Progressing toward goals (slowly)    Frequency  Min 5X/week    PT Plan Current plan remains appropriate    Co-evaluation             End of Session Equipment Utilized During Treatment: Back brace;Gait belt;Oxygen Activity Tolerance: Other (comment);Patient limited by pain (SOB) Patient left: in chair;with call bell/phone within reach;with chair alarm set;with family/visitor present     Time: 1020-1047 PT Time Calculation (min) (ACUTE ONLY): 27 min  Charges:  $Therapeutic Activity: 23-37 mins                    G Codes:      Stacie Roman 04/17/2016, 11:27 AM Wray Kearns, PT, DPT [phone]

## 2016-04-17 NOTE — Progress Notes (Signed)
Patient and family want to return home with home health.  CSW signing off.  Percell Locus Jentzen Minasyan LCSWA [phone]

## 2016-04-17 NOTE — Care Management Important Message (Signed)
Important Message  Patient Details  Name: Stacie Roman MRN: [id number] Date of Birth: 1931/07/16   Medicare Important Message Given:  Yes    Loann Quill 04/17/2016, 9:54 AM

## 2016-04-17 NOTE — Progress Notes (Signed)
Patient ID: TSURUE WARGEL, female   DOB: [birth year], 82 y.o.   MRN: [id number] Significantly improved postop less back pain less likely pain  Strength improved 4 minus out of 5 right lower extremity  Incision clean dry and intact  Mobilized with physical and occupational therapy. Hematocrit 27.4 significantly improved and decreased creatinine.

## 2016-04-17 NOTE — Care Management Note (Signed)
Case Management Note  Patient Details  Name: Stacie Roman MRN: [id number] Date of Birth: 1931-05-08  Subjective/Objective:                    Action/Plan: Pt s/p screw revision and L2-4 fixation. Plan is SNF at discharge. CM following for d/c needs.   Expected Discharge Date:                  Expected Discharge Plan:     In-House Referral:     Discharge planning Services     Post Acute Care Choice:    Choice offered to:     DME Arranged:    DME Agency:     HH Arranged:    HH Agency:     Status of Service:  In process, will continue to follow  Medicare Important Message Given:  Yes Date Medicare IM Given:    Medicare IM give by:    Date Additional Medicare IM Given:    Additional Medicare Important Message give by:     If discussed at [place] of Stay Meetings, dates discussed:    Additional Comments:  Pollie Friar, RN 04/17/2016, 11:55 AM

## 2016-04-18 LAB — GLUCOSE, CAPILLARY
GLUCOSE-CAPILLARY: 137 mg/dL — AB (ref 65–99)
Glucose-Capillary: 125 mg/dL — ABNORMAL HIGH (ref 65–99)

## 2016-04-18 LAB — BASIC METABOLIC PANEL
ANION GAP: 8 (ref 5–15)
BUN: 56 mg/dL — AB (ref 6–20)
CALCIUM: 8.4 mg/dL — AB (ref 8.9–10.3)
CO2: 23 mmol/L (ref 22–32)
Chloride: 105 mmol/L (ref 101–111)
Creatinine, Ser: 2.95 mg/dL — ABNORMAL HIGH (ref 0.44–1.00)
GFR calc Af Amer: 16 mL/min — ABNORMAL LOW (ref >60)
GFR, EST NON AFRICAN AMERICAN: 14 mL/min — AB (ref >60)
Glucose, Bld: 109 mg/dL — ABNORMAL HIGH (ref 65–99)
Potassium: 4.4 mmol/L (ref 3.5–5.1)
Sodium: 136 mmol/L (ref 135–145)

## 2016-04-18 LAB — CBC
HCT: 27 % — ABNORMAL LOW (ref 36.0–46.0)
HEMOGLOBIN: 8.6 g/dL — AB (ref 12.0–15.0)
MCH: 28.3 pg (ref 26.0–34.0)
MCHC: 31.9 g/dL (ref 30.0–36.0)
MCV: 88.8 fL (ref 78.0–100.0)
Platelets: 272 10*3/uL (ref 150–400)
RBC: 3.04 MIL/uL — ABNORMAL LOW (ref 3.87–5.11)
RDW: 16.5 % — AB (ref 11.5–15.5)
WBC: 8.9 10*3/uL (ref 4.0–10.5)

## 2016-04-18 LAB — TSH: TSH: 4.688 u[IU]/mL — AB (ref 0.350–4.500)

## 2016-04-18 MED ORDER — TORSEMIDE 20 MG PO TABS
40.0000 mg | ORAL_TABLET | Freq: Every day | ORAL | Status: DC
  Administered 2016-04-18 – 2016-04-20 (×3): 40 mg via ORAL
  Filled 2016-04-18 (×3): qty 2

## 2016-04-18 MED ORDER — SENNOSIDES-DOCUSATE SODIUM 8.6-50 MG PO TABS
2.0000 | ORAL_TABLET | Freq: Every day | ORAL | Status: DC
  Filled 2016-04-18: qty 2

## 2016-04-18 MED ORDER — CYCLOBENZAPRINE HCL 10 MG PO TABS
5.0000 mg | ORAL_TABLET | Freq: Three times a day (TID) | ORAL | Status: DC | PRN
  Administered 2016-04-18 – 2016-04-19 (×2): 5 mg via ORAL
  Filled 2016-04-18 (×2): qty 1

## 2016-04-18 NOTE — Care Management Important Message (Signed)
Important Message  Patient Details  Name: Stacie Roman MRN: [id number] Date of Birth: July 01, 1931   Medicare Important Message Given:  Yes    Loann Quill 04/18/2016, 9:25 AM

## 2016-04-18 NOTE — Progress Notes (Signed)
Physical Therapy Treatment Patient Details Name: Stacie Roman MRN: [id number] DOB: 1931-02-14 Today's Date: 04/18/2016    History of Present Illness pt presents post L3-4 PLIF with hardware removal.  pt with hx of Pacemaker, CHF, CVA, HTN, CA, CKD, COPD, and back surgery.  Pt now s/p Exploration of lumbar fusion removal of right lumbar three pedicle screw, replacement of right lumbar two pedicle screw, non segmental lumbar two to lumbar four fixation 6/14.     PT Comments    Patient progressing slowly towards PT goals. Reports her abdomen is hurting due to not having had a BM since surgery. Son present during session and performed family training with him as he has decided to take pt home. Had son perform transfers and provided assist as needed and cues for safety/technique. Pt able to side step along side bed today requiring assist for 2 for safety due to bil knee instability and fatigue. Will follow acutely.   Follow Up Recommendations  SNF;Supervision/Assistance - 24 hour (family taking her home)     Equipment Recommendations  None recommended by PT    Recommendations for Other Services       Precautions / Restrictions Precautions Precautions: Fall;Back Precaution Booklet Issued: No Precaution Comments: no recall of precautions Required Braces or Orthoses: Spinal Brace Spinal Brace: Lumbar corset;Applied in sitting position Restrictions Weight Bearing Restrictions: No    Mobility  Bed Mobility Overal bed mobility: Needs Assistance;+2 for physical assistance Bed Mobility: Rolling;Sidelying to Sit;Sit to Sidelying Rolling: +2 for physical assistance;Max assist Sidelying to sit: Mod assist;+2 for physical assistance Supine to sit: +2 for physical assistance;Max assist   Sit to sidelying: Max assist;+2 for physical assistance General bed mobility comments: Son educated on using pad and sequence for back precautions and safety. Cues for log roll technique. Despite cues for  rail, pt reaching for therapists.  Transfers Overall transfer level: Needs assistance Equipment used: 2 person hand held assist;Rolling walker (2 wheeled) Transfers: Sit to/from Stand Sit to Stand: Max assist;+2 physical assistance Stand pivot transfers: Max assist;+2 physical assistance       General transfer comment: SPT bed to/from [facility] with son assisting with transfer and cues provided for safety/technique. Stood from EOB x3, from [facility] x1.   Ambulation/Gait Ambulation/Gait assistance: Mod assist;+2 safety/equipment Ambulation Distance (Feet): 4 Feet Assistive device: Rolling walker (2 wheeled) Gait Pattern/deviations: Step-to pattern;Decreased stride length;Trunk flexed Gait velocity: decreased   General Gait Details: Able to side step along side bed, assist for weight shifting and for RW management. Bil knee instability noted.    Stairs            Wheelchair Mobility    Modified Rankin (Stroke Patients Only)       Balance Overall balance assessment: Needs assistance Sitting-balance support: Feet supported;Bilateral upper extremity supported Sitting balance-Leahy Scale: Poor Sitting balance - Comments: Able to sit EOB with BUE support with feet on floor.  Postural control: Posterior lean Standing balance support: During functional activity Standing balance-Leahy Scale: Poor Standing balance comment: Requires BUE support on RW or son during transfer. Able to perform mini marches in standing with Mod A for support and weight shifting.                    Cognition Arousal/Alertness: Awake/alert Behavior During Therapy: Anxious Overall Cognitive Status: Impaired/Different from baseline Area of Impairment: Memory;Safety/judgement     Memory: Decreased recall of precautions   Safety/Judgement: Decreased awareness of safety;Decreased awareness of deficits   Problem Solving:  Requires verbal cues;Difficulty sequencing;Decreased initiation;Slow  processing General Comments: pt grabbing at therapist even after education on hand placement and use of RW    Exercises      General Comments General comments (skin integrity, edema, etc.): Son present during session.      Pertinent Vitals/Pain Pain Assessment: Faces Faces Pain Scale: Hurts whole lot Pain Location: back at incision Pain Descriptors / Indicators: Operative site guarding;Moaning;Sore;Guarding Pain Intervention(s): Monitored during session;Repositioned    Home Living                      Prior Function            PT Goals (current goals can now be found in the care plan section) Acute Rehab PT Goals Patient Stated Goal: none stated Progress towards PT goals: Progressing toward goals    Frequency  Min 5X/week    PT Plan Current plan remains appropriate    Co-evaluation             End of Session Equipment Utilized During Treatment: Gait belt;Back brace;Oxygen Activity Tolerance: Patient limited by pain Patient left: in bed;with call bell/phone within reach;with bed alarm set;with SCD's reapplied;with family/visitor present     Time: [id number] PT Time Calculation (min) (ACUTE ONLY): 39 min  Charges:  $Therapeutic Activity: 23-37 mins                    G Codes:      Dejan Angert A Alissa Pharr 04/18/2016, 3:37 PM Wray Kearns, [place], DPT [phone]

## 2016-04-18 NOTE — Progress Notes (Signed)
Occupational Therapy Treatment Patient Details Name: Stacie Roman MRN: [id number] DOB: 1931-03-28 Today's Date: 04/18/2016    History of present illness pt presents post L3-4 PLIF with hardware removal.  pt with hx of Pacemaker, CHF, CVA, HTN, CA, CKD, COPD, and back surgery.  Pt now s/p Exploration of lumbar fusion removal of right lumbar three pedicle screw, replacement of right lumbar two pedicle screw, non segmental lumbar two to lumbar four fixation 6/14.    OT comments  Family educated on don of brace and back precautions with basic transfer. Pt with x2 children completed chair transfer. Pt plans to d/c home with family (A) due to lack of insurance coverage for [facility]. Pt is high fall risk. Pt has all necessary DME for home including hospital bed. Pt plans to sleep in recliner but has a lift recliner as well.    Follow Up Recommendations  SNF;Supervision/Assistance - 24 hour    Equipment Recommendations  Wheelchair cushion (measurements OT);Wheelchair (measurements OT);3 in 1 bedside comode;Hospital bed    Recommendations for Other Services      Precautions / Restrictions Precautions Precautions: Fall;Back Precaution Comments: no recall of precautions Required Braces or Orthoses: Spinal Brace Spinal Brace: Lumbar corset;Applied in sitting position Restrictions Weight Bearing Restrictions: No       Mobility Bed Mobility Overal bed mobility: Needs Assistance;+2 for physical assistance Bed Mobility: Supine to Sit Rolling: +2 for physical assistance;Max assist   Supine to sit: +2 for physical assistance;Max assist     General bed mobility comments: children educated on using pad and sequence for back precautions and safety  Transfers Overall transfer level: Needs assistance Equipment used: 2 person hand held assist Transfers: Sit to/from Stand Sit to Stand: Max assist;+2 physical assistance         General transfer comment: pt needed cues for sequence and knee  flexed during transfer. OT present and ready to block knees if buckle occurred. family educated on blocking bil Le and will need to practice. family educated on positioning 3n1 or chair for closer transfer and safety    Balance Overall balance assessment: Needs assistance Sitting-balance support: Bilateral upper extremity supported;Feet supported Sitting balance-Leahy Scale: Zero     Standing balance support: Bilateral upper extremity supported;During functional activity Standing balance-Leahy Scale: Poor                     ADL Overall ADL's : Needs assistance/impaired                                       General ADL Comments: session focused on basic transfers with family. pt will d/c home due to inability to have insurance coverage for SNF. pt will have 4 children alternating care. pt reports inabiilty to void bowels. Pt and family educated on the need to complete transfers and mobilize to help bowels. pt agreeable to OOB to chair for pending meal. pt s son and daughter present to complete transfer      Vision                     Perception     Praxis      Cognition   Behavior During Therapy: Flat affect;Anxious Overall Cognitive Status: Impaired/Different from baseline Area of Impairment: Safety/judgement     Memory: Decreased recall of precautions    Safety/Judgement: Decreased awareness of safety;Decreased awareness of deficits  Extremity/Trunk Assessment               Exercises     Shoulder Instructions       General Comments      Pertinent Vitals/ Pain       Pain Assessment: Faces Faces Pain Scale: Hurts whole lot Pain Descriptors / Indicators: Operative site guarding Pain Intervention(s): Monitored during session;Premedicated before session;Repositioned  Home Living                                          Prior Functioning/Environment              Frequency Min 2X/week      Progress Toward Goals  OT Goals(current goals can now be found in the care plan section)  Progress towards OT goals: Not progressing toward goals - comment  Acute Rehab OT Goals Patient Stated Goal: none stated OT Goal Formulation: With patient/family Time For Goal Achievement: 04/25/16 Potential to Achieve Goals: Fair ADL Goals Pt Will Perform Grooming: with min assist;standing Pt Will Perform Upper Body Bathing: with min guard assist;sitting Pt Will Perform Lower Body Bathing: with min assist;with adaptive equipment;sit to/from stand Pt Will Transfer to Toilet: with min assist;stand pivot transfer;bedside commode Pt Will Perform Toileting - Clothing Manipulation and hygiene: with min assist;with adaptive equipment;sit to/from stand  Plan Discharge plan remains appropriate    Co-evaluation                 End of Session Equipment Utilized During Treatment: Gait belt;Back brace   Activity Tolerance Patient limited by fatigue   Patient Left in chair;with call bell/phone within reach;with chair alarm set;with family/visitor present   Nurse Communication Mobility status;Precautions        Time: 1035-1110 OT Time Calculation (min): 35 min  Charges: OT General Charges $OT Visit: 1 Procedure OT Treatments $Self Care/Home Management : 23-37 mins  Parke Poisson B 04/18/2016, 3:03 PM   Jeri Modena   OTR/L Pager: [phone] Office: [phone] .

## 2016-04-18 NOTE — Progress Notes (Signed)
PROGRESS NOTE                                                                                                                                                                                                             Patient Demographics:    Stacie Roman, is a 82 y.o. female, DOB - 1931/11/02, OZ:[id number]  Admit date - 04/09/2016   Admitting Physician Kary Kos, MD  Outpatient Primary MD for the patient is Stacie Maid, MD  LOS - 9   No chief complaint on file.      Brief Narrative  82 y.o. female with past medical history significant for CHF with cardiac pacemaker, CVA, HTN, hypothyroidism, GERD, frequent nosebleeds, CJD, COPD who presented to [facility] on 04/09/2016 for elective L4-L5 fusion , Hospitalist service consulted for post operative anemia, and worsening renal function.   Subjective:    Rudene Re post op day two, report feeling drowsy after flexeril, no bm, son in room,     Assessment  & Plan :    Active Problems:   Spondylolisthesis at [id number] level   Diastolic CHF (HCC)   Normocytic anemia   AKI (acute kidney injury) ([place])   CKD (chronic kidney disease) stage 4, GFR 15-29 ml/min (HCC)   Weakness   Spondylosis of lumbar joint   Anemia - Hgb 5.9, baseline 10. Likely from combination of operative loss, Worsening renal function, and GI. Normocytic. Per family, this has happened w/ previous surgical procedures - FOBT pending - Transfused 2 units PRBC 6/12, hemoglobin improved, today is 8.7, monitor closely and transfuse as needed - iron studies and anemia panel not likely beneficial at this point as PRBC already started.  - hold PO iron, consider IV iron if needed  Acute on chronic kidney disease stage III - Baseline creatinine 2.5, peaked at 4.3, most likely due to prerenal giving anemia and diuretic use - Will give gentle hydration today giving known history of diastolic CHF, and will hold torsemide,  difficult function at baseline tomorrow, resume torsemide at a lower dose - cr improving, consider restart home diuretic on [id number]  Chronic diastolic CHF - Vehicle showing EF [id number], grade 2 diastolic dysfunction. - Currently appears to be dry, s/p gentle hydration, her renal function has improved, consider  resume torsemide 6/16  HTN - Continue with home medication,  Hypothyroidism - Continue with levothyroxine  COPD on home oxygen - Continue with nebs as needed  Pain control: continue current regimen, add neurontin  Constipation: stool softener,    Lab Results  Component Value Date   PLT 272 04/18/2016    Antibiotics  :    Anti-infectives    Start     Dose/Rate Route Frequency Ordered Stop   04/17/16 0400  ceFAZolin (ANCEF) IVPB 2g/100 mL premix     2 g 200 mL/hr over 30 Minutes Intravenous Every 12 hours 04/17/16 0330 04/19/16 0359   04/16/16 1503  vancomycin (VANCOCIN) powder  Status:  Discontinued       As needed 04/16/16 1503 04/16/16 1600   04/16/16 1502  bacitracin 50,000 Units in sodium chloride irrigation 0.9 % 500 mL irrigation  Status:  Discontinued       As needed 04/16/16 1502 04/16/16 1600   04/16/16 1448  vancomycin (VANCOCIN) 1000 MG powder    Comments:  Key, Jennifer   : cabinet override      04/16/16 1448 04/17/16 0259   04/16/16 1404  ceFAZolin (ANCEF) 2-4 GM/100ML-% IVPB    Comments:  Cato, Sarah   : cabinet override      04/16/16 1404 04/16/16 1416   04/09/16 2100  ceFAZolin (ANCEF) IVPB 1 g/50 mL premix     1 g 100 mL/hr over 30 Minutes Intravenous Every 12 hours 04/09/16 1539 04/11/16 1029   04/09/16 1539  acyclovir (ZOVIRAX) tablet 800 mg  Status:  Discontinued     800 mg Oral Every 8 hours PRN 04/09/16 1539 04/15/16 1359   04/09/16 1154  vancomycin (VANCOCIN) powder  Status:  Discontinued       As needed 04/09/16 1154 04/09/16 1231   04/09/16 1149  vancomycin (VANCOCIN) 1000 MG powder    Comments:  Atilano Median   : cabinet override       04/09/16 1149 04/09/16 2359   04/09/16 1005  50,000 units bacitracin in 0.9% normal saline 250 mL irrigation  Status:  Discontinued       As needed 04/09/16 1005 04/09/16 1231   04/09/16 0600  ceFAZolin (ANCEF) IVPB 2g/100 mL premix     2 g 200 mL/hr over 30 Minutes Intravenous To [facility] 04/08/16 1239 04/09/16 0855        Objective:   Filed Vitals:   04/17/16 2104 04/17/16 2127 04/18/16 0113 04/18/16 0530  BP:  187/54 173/52 169/51  Pulse: 70 59 62 72  Temp:  98.4 F (36.9 C) 98.1 F (36.7 C) 97.9 F (36.6 C)  TempSrc:  Oral Oral Oral  Resp: 20 20 20 20   Height:      Weight:    99.02 kg (218 lb 4.8 oz)  SpO2: 96% 96% 97% 98%    Wt Readings from Last 3 Encounters:  04/18/16 99.02 kg (218 lb 4.8 oz)  04/02/16 87.573 kg (193 lb 1 oz)     Intake/Output Summary (Last 24 hours) at 04/18/16 0928 Last data filed at 04/18/16 0533  Gross per 24 hour  Intake    203 ml  Output   1130 ml  Net   -927 ml     Physical Exam  Awake Alert, Oriented X 3,  [place].AT,PERRAL Supple Neck,No JVD, No cervical lymphadenopathy appriciated.  Symmetrical Chest wall movement, Good air movement bilaterally, CTAB RRR,No Gallops,Rubs or new Murmurs, No Parasternal Heave +ve B.Sounds, Abd Soft, No tenderness,  No Cyanosis, Clubbing or edema, No new Rash or bruise  Data Review:    CBC  Recent Labs Lab 04/15/16 0632 04/16/16 0522 04/16/16 1638 04/17/16 0526 04/18/16 0721  WBC 10.7* 9.4 10.5 10.3 8.9  HGB 8.7* 8.2* 7.8* 9.0* 8.6*  HCT 26.4* 25.4* 24.5* 27.4* 27.0*  PLT 238 260 265 248 272  MCV 87.4 88.5 89.1 87.8 88.8  MCH 28.8 28.6 28.4 28.8 28.3  MCHC 33.0 32.3 31.8 32.8 31.9  RDW 16.7* 16.6* 16.4* 17.0* 16.5*    Chemistries   Recent Labs Lab 04/14/16 0941 04/15/16 0632 04/16/16 0522 04/17/16 0526 04/18/16 0721  NA 131* 135 133* 136 136  K 3.7 4.0 4.4 4.7 4.4  CL 98* 99* 101 103 105  CO2 20* 22 23 20* 23  GLUCOSE 171* 110* 118* 98 109*  BUN 61* 62* 63*  57* 56*  CREATININE 4.36* 3.63* 3.31* 2.92* 2.95*  CALCIUM 8.0* 8.4* 8.3* 8.3* 8.4*  AST 12*  --   --   --   --   ALT <5*  --   --   --   --   ALKPHOS 73  --   --   --   --   BILITOT 0.5  --   --   --   --    ------------------------------------------------------------------------------------------------------------------ No results for input(s): CHOL, HDL, LDLCALC, TRIG, CHOLHDL, LDLDIRECT in the last 72 hours.  No results found for: HGBA1C ------------------------------------------------------------------------------------------------------------------  Recent Labs  04/18/16 0721  TSH 4.688*   ------------------------------------------------------------------------------------------------------------------ No results for input(s): VITAMINB12, FOLATE, FERRITIN, TIBC, IRON, RETICCTPCT in the last 72 hours.  Coagulation profile No results for input(s): INR, PROTIME in the last 168 hours.  No results for input(s): DDIMER in the last 72 hours.  Cardiac Enzymes No results for input(s): CKMB, TROPONINI, MYOGLOBIN in the last 168 hours.  Invalid input(s): CK ------------------------------------------------------------------------------------------------------------------ No results found for: BNP  Inpatient Medications  Scheduled Meds: . aspirin EC  81 mg Oral BH-q7a  . bisacodyl  10 mg Oral QHS  . bisacodyl  5 mg Oral Daily  . bupivacaine liposome  20 mL Infiltration Once  .  ceFAZolin (ANCEF) IV  2 g Intravenous Q12H  . cholecalciferol  5,000 Units Oral BH-q7a  . cloNIDine  0.1 mg Oral BID  . docusate sodium  200 mg Oral BID  . gabapentin  100 mg Oral QHS  . ipratropium-albuterol  3 mL Nebulization Daily  . isosorbide mononitrate  90 mg Oral q1800  . labetalol  400 mg Oral TID  . levothyroxine  50 mcg Oral QAC breakfast  . loratadine  10 mg Oral BH-q7a  . mometasone-formoterol  2 puff Inhalation BID  . NIFEdipine  60 mg Oral BID  . oxybutynin  10 mg Oral q1800  .  polyethylene glycol  17 g Oral Daily  . sodium chloride flush  3 mL Intravenous Q12H  . torsemide  40 mg Oral BH-q7a   Continuous Infusions: . sodium chloride     PRN Meds:.acetaminophen **OR** acetaminophen, acetaminophen-codeine, albuterol, bisacodyl, cyclobenzaprine, diphenhydrAMINE **OR** diphenhydrAMINE, HYDROmorphone (DILAUDID) injection, menthol-cetylpyridinium **OR** phenol, morphine injection, ondansetron (ZOFRAN) IV, senna-docusate, sodium chloride flush  Micro Results No results found for this or any previous visit (from the past 240 hour(s)).  Radiology Reports Dg Lumbar Spine 2-3 Views  04/16/2016  CLINICAL DATA:  Lumbar spine hardware revision. EXAM: DG C-ARM 61-120 MIN; LUMBAR SPINE - 2-3 VIEW COMPARISON:  Lumbar spine CT 04/13/2016. FINDINGS: Spot PA and lateral fluoroscopic images of the lumbar spine are submitted from the operating room. Numbering is based on  the location of the pre-existing hardware at L3-4. These views demonstrate the placement of a right pedicle screw at L2. There are soft tissue retractors and sponges in the surgical bed. No interconnecting rod has been placed. IMPRESSION: Intraoperative views during extension of the lumbar fusion superiorly across the L2-3 disc space. Electronically Signed   By: Richardean Sale M.D.   On: 04/16/2016 17:50   Dg Lumbar Spine 2-3 Views  04/09/2016  CLINICAL DATA:  Post L3-L4 fusion EXAM: LUMBAR SPINE - 2-3 VIEW; DG C-ARM 61-120 MIN COMPARISON:  Lumbar spine CT 10/11/2015 FINDINGS: Posterior transpedicular screws noted I assume at L3-L4 level. There is a disc spacer I assume L3 L4 level. The previous metallic screw at L5 level is not identified. There is anatomic alignment. Please note the lower lumbar spine is not visualized therefore numbering levels is very difficult. IMPRESSION: Posterior transpedicular screws noted I assume at L3-L4 level. There is a disc spacer I assume L3 L4 level. The previous metallic screw at L5 level is  not identified. There is anatomic alignment. Please note the lower lumbar spine is not visualized therefore numbering levels is very difficult. Fluoroscopy time was 29 seconds.  Please see the operative report. These results were called by telephone at the time of interpretation on 04/09/2016 at 12:49 pm to Dr. Kary Kos , who verbally acknowledged these results. Electronically Signed   By: Lahoma Crocker M.D.   On: 04/09/2016 12:50   Ct Lumbar Spine Wo Contrast  04/13/2016  ADDENDUM REPORT: 04/13/2016 13:32 ADDENDUM: The original report was by Dr. Van Clines. The following addendum is by Dr. Van Clines: These results were called by telephone at the time of interpretation on 04/13/2016 at 1:28 pm to Dr. Ashok Pall , who verbally acknowledged these results. Electronically Signed   By: Van Clines M.D.   On: 04/13/2016 13:32  04/13/2016  CLINICAL DATA:  Hardware placement. Weakness. Back pain common discomfort. Recent L3-4 fusion 04/09/2016 EXAM: CT LUMBAR SPINE WITHOUT CONTRAST TECHNIQUE: Multidetector CT imaging of the lumbar spine was performed without intravenous contrast administration. Multiplanar CT image reconstructions were also generated. COMPARISON:  Multiple exams, including 04/09/2016 and 10/11/2015 FINDINGS: Partially sacralized L5 vertebra noted. This numbering is consistent with the recent prior CT of 10/11/2015. Compared to 10/11/2015, the L4-5 posterolateral rod and pedicle screw fixation has been removed and there has been placement of posterolateral rod and pedicle screw fixators at L3-4. Interbody spacer noted at L3-4. There was previously 6 mm anterolisthesis at L3-4 and this has not changed. The right L3 pedicle screw skirts the superolateral border of the pedicle and tracks just lateral to the cortical margin of the L3 vertebral body in the paraspinal tissues. The right L2 nerve tracks just above and lateral to the threading of the right L3 pedicle screw. Solid interbody  fusion at L4-5. Posterior decompression from L2-3 through L4-5. Small right pleural effusion with mild bibasilar atelectasis. Aortoiliac atherosclerotic vascular disease. Expected findings from recent operative intervention along the posterior subcutaneous tissues. No visible impingement at the L3-4 or L4-5 level, despite the intervertebral spurring in the right neural foramen at L4-5. No other impingement identified. IMPRESSION: 1. The right L3 pedicle screw captures the superolateral margin of the pedicle and abuts the lateral cortical margin of the vertebral body. The right L2 nerve skirts along the superior and lateral margin of the threatening of the screw, and could be conceivably irritated by the screw. Otherwise expected postoperative appearance. 2. Please note that as on prior  exams, the L5 vertebral level is sacralized/transitional. Careful correlation with this numbering strategy, which is stable compared to the prior CT scan, is recommended. Radiology assistant personnel have been notified to put me in telephone contact with the referring physician or the referring physician's clinical representative in order to discuss these findings. Once this communication is established I will issue an addendum to this report for documentation purposes. Electronically Signed: By: Van Clines M.D. On: 04/13/2016 12:52   Dg Chest Port 1 View  04/14/2016  CLINICAL DATA:  Status post lumbar surgery 5 days ago. Hypertension. Congestive heart failure. EXAM: PORTABLE CHEST 1 VIEW COMPARISON:  PA and lateral chest 06/16/2015. FINDINGS: There is cardiomegaly without edema. The lungs are clear. No pneumothorax or pleural effusion. Pacing device is noted. IMPRESSION: No acute disease. Electronically Signed   By: Inge Rise M.D.   On: 04/14/2016 14:16   Dg Abd 2 Views  04/12/2016  CLINICAL DATA:  Ileus. EXAM: ABDOMEN - 2 VIEW COMPARISON:  None. FINDINGS: Bowel gas pattern is nonobstructive. No convincing  evidence of ileus. Gas seen to the level of the rectum. A left-sided drain is seen. No other acute abnormalities. Pedicle rods and screws overlie the lower lumbar spine. IMPRESSION: No definite ileus.  No acute abnormalities. Electronically Signed   By: Dorise Bullion III M.D   On: 04/12/2016 11:54   Dg C-arm 1-60 Min  04/16/2016  CLINICAL DATA:  Lumbar spine hardware revision. EXAM: DG C-ARM 61-120 MIN; LUMBAR SPINE - 2-3 VIEW COMPARISON:  Lumbar spine CT 04/13/2016. FINDINGS: Spot PA and lateral fluoroscopic images of the lumbar spine are submitted from the operating room. Numbering is based on the location of the pre-existing hardware at L3-4. These views demonstrate the placement of a right pedicle screw at L2. There are soft tissue retractors and sponges in the surgical bed. No interconnecting rod has been placed. IMPRESSION: Intraoperative views during extension of the lumbar fusion superiorly across the L2-3 disc space. Electronically Signed   By: Richardean Sale M.D.   On: 04/16/2016 17:50   Dg C-arm 1-60 Min  04/09/2016  CLINICAL DATA:  Post L3-L4 fusion EXAM: LUMBAR SPINE - 2-3 VIEW; DG C-ARM 61-120 MIN COMPARISON:  Lumbar spine CT 10/11/2015 FINDINGS: Posterior transpedicular screws noted I assume at L3-L4 level. There is a disc spacer I assume L3 L4 level. The previous metallic screw at L5 level is not identified. There is anatomic alignment. Please note the lower lumbar spine is not visualized therefore numbering levels is very difficult. IMPRESSION: Posterior transpedicular screws noted I assume at L3-L4 level. There is a disc spacer I assume L3 L4 level. The previous metallic screw at L5 level is not identified. There is anatomic alignment. Please note the lower lumbar spine is not visualized therefore numbering levels is very difficult. Fluoroscopy time was 29 seconds.  Please see the operative report. These results were called by telephone at the time of interpretation on 04/09/2016 at 12:49 pm  to Dr. Kary Kos , who verbally acknowledged these results. Electronically Signed   By: Lahoma Crocker M.D.   On: 04/09/2016 12:50    Time Spent in minutes  25 minutes   Zakhari Fogel M.D PhD on 04/18/2016 at 9:28 AM  Between 7am to 7pm - Pager - [phone]  After 7pm go to [email or web address] - password [facility]  [facility] -  Office  [phone]

## 2016-04-18 NOTE — Progress Notes (Signed)
No acute events Complains of left paraspinous pain Moves legs well Dressing looks good Low drain output, d/c drain Crit stable Continue therapy Dispo planning

## 2016-04-19 LAB — CBC WITH DIFFERENTIAL/PLATELET
BASOS PCT: 0 %
Basophils Absolute: 0 10*3/uL (ref 0.0–0.1)
Eosinophils Absolute: 0.4 10*3/uL (ref 0.0–0.7)
Eosinophils Relative: 5 %
HCT: 26.1 % — ABNORMAL LOW (ref 36.0–46.0)
HEMOGLOBIN: 8.3 g/dL — AB (ref 12.0–15.0)
LYMPHS ABS: 1.5 10*3/uL (ref 0.7–4.0)
LYMPHS PCT: 19 %
MCH: 28.4 pg (ref 26.0–34.0)
MCHC: 31.8 g/dL (ref 30.0–36.0)
MCV: 89.4 fL (ref 78.0–100.0)
MONO ABS: 1.4 10*3/uL — AB (ref 0.1–1.0)
MONOS PCT: 18 %
NEUTROS ABS: 4.5 10*3/uL (ref 1.7–7.7)
Neutrophils Relative %: 58 %
Platelets: 295 10*3/uL (ref 150–400)
RBC: 2.92 MIL/uL — ABNORMAL LOW (ref 3.87–5.11)
RDW: 16 % — ABNORMAL HIGH (ref 11.5–15.5)
WBC: 7.8 10*3/uL (ref 4.0–10.5)

## 2016-04-19 LAB — BASIC METABOLIC PANEL
ANION GAP: 10 (ref 5–15)
BUN: 59 mg/dL — AB (ref 6–20)
CALCIUM: 8.5 mg/dL — AB (ref 8.9–10.3)
CO2: 22 mmol/L (ref 22–32)
CREATININE: 2.84 mg/dL — AB (ref 0.44–1.00)
Chloride: 101 mmol/L (ref 101–111)
GFR calc Af Amer: 17 mL/min — ABNORMAL LOW (ref >60)
GFR, EST NON AFRICAN AMERICAN: 14 mL/min — AB (ref >60)
GLUCOSE: 87 mg/dL (ref 65–99)
Potassium: 4.3 mmol/L (ref 3.5–5.1)
Sodium: 133 mmol/L — ABNORMAL LOW (ref 135–145)

## 2016-04-19 LAB — PROTIME-INR
INR: 1.21 (ref 0.00–1.49)
PROTHROMBIN TIME: 15.4 s — AB (ref 11.6–15.2)

## 2016-04-19 MED ORDER — LEVOTHYROXINE SODIUM 75 MCG PO TABS
75.0000 ug | ORAL_TABLET | Freq: Every day | ORAL | Status: DC
  Administered 2016-04-20: 75 ug via ORAL
  Filled 2016-04-19: qty 1

## 2016-04-19 MED ORDER — MINERAL OIL RE ENEM
1.0000 | ENEMA | Freq: Once | RECTAL | Status: DC
  Filled 2016-04-19: qty 1

## 2016-04-19 MED ORDER — GABAPENTIN 100 MG PO CAPS
100.0000 mg | ORAL_CAPSULE | Freq: Two times a day (BID) | ORAL | Status: DC
  Administered 2016-04-19 – 2016-04-20 (×4): 100 mg via ORAL
  Filled 2016-04-19 (×4): qty 1

## 2016-04-19 NOTE — Progress Notes (Addendum)
PROGRESS NOTE                                                                                                                                                                                                             Patient Demographics:    Stacie Roman, is a 82 y.o. female, DOB - 1930/11/14, [id number]  Admit date - 04/09/2016   Admitting Physician Kary Kos, MD  Outpatient Primary MD for the patient is [name], MD  LOS - 10   No chief complaint on file.      Brief Narrative  82 y.o. female with past medical history significant for CHF with cardiac pacemaker, CVA, HTN, hypothyroidism, GERD, frequent nosebleeds, CJD, COPD who presented to [facility] on 04/09/2016 for elective L4-L5 fusion , Hospitalist service consulted for post operative anemia, and worsening renal function.   Subjective:    Stacie Roman post op day three, feeling better, no bm, requesting enema, son in room,     Assessment  & Plan :    Active Problems:   Spondylolisthesis at [id number] level   Diastolic CHF (HCC)   Normocytic anemia   AKI (acute kidney injury) ([place])   CKD (chronic kidney disease) stage 4, GFR 15-29 ml/min (HCC)   Weakness   Spondylosis of lumbar joint   Anemia - Hgb 5.9, baseline 10. Likely from combination of operative loss, Worsening renal function, and GI. Normocytic. Per family, this has happened w/ previous surgical procedures - FOBT pending - Transfused 2 units PRBC 6/12, hemoglobin improved, today is 8.7, monitor closely and transfuse as needed - iron studies and anemia panel not likely beneficial at this point as PRBC already started.  - hold PO iron, consider IV iron if needed  Acute on chronic kidney disease stage III - Baseline creatinine 2.5, peaked at 4.3, most likely due to prerenal giving anemia and diuretic use - Will give gentle hydration today giving known history of diastolic CHF, and will hold torsemide,  difficult function at baseline tomorrow, resume torsemide at a lower dose - cr improving,  restarted home diuretic on [id number]  Chronic diastolic CHF - Vehicle showing EF [id number], grade 2 diastolic dysfunction. - Currently appears to be dry, s/p gentle hydration, her renal function has improved,   resume torsemide 6/16  HTN - Continue with home medication,   Hypothyroidism -  Continue with levothyroxine tsh 4.6 ,  increase levo to 77mcg daily  COPD on home oxygen - Continue with nebs as needed  Pain control: continue current regimen, reported combination of tylenol and neurontin works well for her pain, will increase neurontin to 100mg  bid, need to monitor cr level.  Constipation: stool softener, enema   Lab Results  Component Value Date   PLT 295 04/19/2016    Antibiotics  :    Anti-infectives    Start     Dose/Rate Route Frequency Ordered Stop   04/17/16 0400  ceFAZolin (ANCEF) IVPB 2g/100 mL premix     2 g 200 mL/hr over 30 Minutes Intravenous Every 12 hours 04/17/16 0330 04/18/16 1543   04/16/16 1503  vancomycin (VANCOCIN) powder  Status:  Discontinued       As needed 04/16/16 1503 04/16/16 1600   04/16/16 1502  bacitracin 50,000 Units in sodium chloride irrigation 0.9 % 500 mL irrigation  Status:  Discontinued       As needed 04/16/16 1502 04/16/16 1600   04/16/16 1448  vancomycin (VANCOCIN) 1000 MG powder    Comments:  Key, Jennifer   : cabinet override      04/16/16 1448 04/17/16 0259   04/16/16 1404  ceFAZolin (ANCEF) 2-4 GM/100ML-% IVPB    Comments:  Cato, Sarah   : cabinet override      04/16/16 1404 04/16/16 1416   04/09/16 2100  ceFAZolin (ANCEF) IVPB 1 g/50 mL premix     1 g 100 mL/hr over 30 Minutes Intravenous Every 12 hours 04/09/16 1539 04/11/16 1029   04/09/16 1539  acyclovir (ZOVIRAX) tablet 800 mg  Status:  Discontinued     800 mg Oral Every 8 hours PRN 04/09/16 1539 04/15/16 1359   04/09/16 1154  vancomycin (VANCOCIN) powder  Status:  Discontinued        As needed 04/09/16 1154 04/09/16 1231   04/09/16 1149  vancomycin (VANCOCIN) 1000 MG powder    Comments:  Atilano Median   : cabinet override      04/09/16 1149 04/09/16 2359   04/09/16 1005  50,000 units bacitracin in 0.9% normal saline 250 mL irrigation  Status:  Discontinued       As needed 04/09/16 1005 04/09/16 1231   04/09/16 0600  ceFAZolin (ANCEF) IVPB 2g/100 mL premix     2 g 200 mL/hr over 30 Minutes Intravenous To [facility] 04/08/16 1239 04/09/16 0855        Objective:   Filed Vitals:   04/19/16 0127 04/19/16 0200 04/19/16 0541 04/19/16 0757  BP: 183/45 170/50 156/49   Pulse: 63  61   Temp: 98.5 F (36.9 C)  98.7 F (37.1 C)   TempSrc: Oral  Oral   Resp: 20  18   Height:      Weight:   100.744 kg (222 lb 1.6 oz)   SpO2: 97%  97% 91%    Wt Readings from Last 3 Encounters:  04/19/16 100.744 kg (222 lb 1.6 oz)  04/02/16 87.573 kg (193 lb 1 oz)     Intake/Output Summary (Last 24 hours) at 04/19/16 0913 Last data filed at 04/19/16 0543  Gross per 24 hour  Intake    360 ml  Output   2000 ml  Net  -1640 ml     Physical Exam  Awake Alert, Oriented X 3,  [place].AT,PERRAL Supple Neck,No JVD, No cervical lymphadenopathy appriciated.  Symmetrical Chest wall movement, Good air movement bilaterally, CTAB RRR,No Gallops,Rubs or new  Murmurs, No Parasternal Heave +ve B.Sounds, Abd Soft, No tenderness,  No Cyanosis, Clubbing or edema, No new Rash or bruise      Data Review:    CBC  Recent Labs Lab 04/16/16 0522 04/16/16 1638 04/17/16 0526 04/18/16 0721 04/19/16 0530  WBC 9.4 10.5 10.3 8.9 7.8  HGB 8.2* 7.8* 9.0* 8.6* 8.3*  HCT 25.4* 24.5* 27.4* 27.0* 26.1*  PLT 260 265 248 272 295  MCV 88.5 89.1 87.8 88.8 89.4  MCH 28.6 28.4 28.8 28.3 28.4  MCHC 32.3 31.8 32.8 31.9 31.8  RDW 16.6* 16.4* 17.0* 16.5* 16.0*  LYMPHSABS  --   --   --   --  1.5  MONOABS  --   --   --   --  1.4*  EOSABS  --   --   --   --  0.4  BASOSABS  --   --   --   --  0.0     Chemistries   Recent Labs Lab 04/14/16 0941 04/15/16 0632 04/16/16 0522 04/17/16 0526 04/18/16 0721 04/19/16 0530  NA 131* 135 133* 136 136 133*  K 3.7 4.0 4.4 4.7 4.4 4.3  CL 98* 99* 101 103 105 101  CO2 20* 22 23 20* 23 22  GLUCOSE 171* 110* 118* 98 109* 87  BUN 61* 62* 63* 57* 56* 59*  CREATININE 4.36* 3.63* 3.31* 2.92* 2.95* 2.84*  CALCIUM 8.0* 8.4* 8.3* 8.3* 8.4* 8.5*  AST 12*  --   --   --   --   --   ALT <5*  --   --   --   --   --   ALKPHOS 73  --   --   --   --   --   BILITOT 0.5  --   --   --   --   --    ------------------------------------------------------------------------------------------------------------------ No results for input(s): CHOL, HDL, LDLCALC, TRIG, CHOLHDL, LDLDIRECT in the last 72 hours.  No results found for: HGBA1C ------------------------------------------------------------------------------------------------------------------  Recent Labs  04/18/16 0721  TSH 4.688*   ------------------------------------------------------------------------------------------------------------------ No results for input(s): VITAMINB12, FOLATE, FERRITIN, TIBC, IRON, RETICCTPCT in the last 72 hours.  Coagulation profile  Recent Labs Lab 04/19/16 0530  INR 1.21    No results for input(s): DDIMER in the last 72 hours.  Cardiac Enzymes No results for input(s): CKMB, TROPONINI, MYOGLOBIN in the last 168 hours.  Invalid input(s): CK ------------------------------------------------------------------------------------------------------------------ No results found for: BNP  Inpatient Medications  Scheduled Meds: . aspirin EC  81 mg Oral BH-q7a  . bisacodyl  10 mg Oral QHS  . bisacodyl  5 mg Oral Daily  . bupivacaine liposome  20 mL Infiltration Once  . cholecalciferol  5,000 Units Oral BH-q7a  . cloNIDine  0.1 mg Oral BID  . docusate sodium  200 mg Oral BID  . gabapentin  100 mg Oral QHS  . ipratropium-albuterol  3 mL Nebulization Daily  .  isosorbide mononitrate  90 mg Oral q1800  . labetalol  400 mg Oral TID  . levothyroxine  50 mcg Oral QAC breakfast  . loratadine  10 mg Oral BH-q7a  . mometasone-formoterol  2 puff Inhalation BID  . NIFEdipine  60 mg Oral BID  . oxybutynin  10 mg Oral q1800  . polyethylene glycol  17 g Oral Daily  . senna-docusate  2 tablet Oral QHS  . sodium chloride flush  3 mL Intravenous Q12H  . torsemide  40 mg Oral Daily   Continuous Infusions: .  sodium chloride     PRN Meds:.acetaminophen **OR** acetaminophen, acetaminophen-codeine, albuterol, bisacodyl, cyclobenzaprine, diphenhydrAMINE **OR** diphenhydrAMINE, HYDROmorphone (DILAUDID) injection, menthol-cetylpyridinium **OR** phenol, morphine injection, ondansetron (ZOFRAN) IV, sodium chloride flush  Micro Results No results found for this or any previous visit (from the past 240 hour(s)).  Radiology Reports Dg Lumbar Spine 2-3 Views  04/16/2016  CLINICAL DATA:  Lumbar spine hardware revision. EXAM: DG C-ARM 61-120 MIN; LUMBAR SPINE - 2-3 VIEW COMPARISON:  Lumbar spine CT 04/13/2016. FINDINGS: Spot PA and lateral fluoroscopic images of the lumbar spine are submitted from the operating room. Numbering is based on the location of the pre-existing hardware at L3-4. These views demonstrate the placement of a right pedicle screw at L2. There are soft tissue retractors and sponges in the surgical bed. No interconnecting rod has been placed. IMPRESSION: Intraoperative views during extension of the lumbar fusion superiorly across the L2-3 disc space. Electronically Signed   By: Richardean Sale M.D.   On: 04/16/2016 17:50   Dg Lumbar Spine 2-3 Views  04/09/2016  CLINICAL DATA:  Post L3-L4 fusion EXAM: LUMBAR SPINE - 2-3 VIEW; DG C-ARM 61-120 MIN COMPARISON:  Lumbar spine CT 10/11/2015 FINDINGS: Posterior transpedicular screws noted I assume at L3-L4 level. There is a disc spacer I assume L3 L4 level. The previous metallic screw at L5 level is not identified.  There is anatomic alignment. Please note the lower lumbar spine is not visualized therefore numbering levels is very difficult. IMPRESSION: Posterior transpedicular screws noted I assume at L3-L4 level. There is a disc spacer I assume L3 L4 level. The previous metallic screw at L5 level is not identified. There is anatomic alignment. Please note the lower lumbar spine is not visualized therefore numbering levels is very difficult. Fluoroscopy time was 29 seconds.  Please see the operative report. These results were called by telephone at the time of interpretation on 04/09/2016 at 12:49 pm to Dr. Kary Kos , who verbally acknowledged these results. Electronically Signed   By: Lahoma Crocker M.D.   On: 04/09/2016 12:50   Ct Lumbar Spine Wo Contrast  04/13/2016  ADDENDUM REPORT: 04/13/2016 13:32 ADDENDUM: The original report was by Dr. Van Clines. The following addendum is by Dr. Van Clines: These results were called by telephone at the time of interpretation on 04/13/2016 at 1:28 pm to Dr. Ashok Pall , who verbally acknowledged these results. Electronically Signed   By: Van Clines M.D.   On: 04/13/2016 13:32  04/13/2016  CLINICAL DATA:  Hardware placement. Weakness. Back pain common discomfort. Recent L3-4 fusion 04/09/2016 EXAM: CT LUMBAR SPINE WITHOUT CONTRAST TECHNIQUE: Multidetector CT imaging of the lumbar spine was performed without intravenous contrast administration. Multiplanar CT image reconstructions were also generated. COMPARISON:  Multiple exams, including 04/09/2016 and 10/11/2015 FINDINGS: Partially sacralized L5 vertebra noted. This numbering is consistent with the recent prior CT of 10/11/2015. Compared to 10/11/2015, the L4-5 posterolateral rod and pedicle screw fixation has been removed and there has been placement of posterolateral rod and pedicle screw fixators at L3-4. Interbody spacer noted at L3-4. There was previously 6 mm anterolisthesis at L3-4 and this has not  changed. The right L3 pedicle screw skirts the superolateral border of the pedicle and tracks just lateral to the cortical margin of the L3 vertebral body in the paraspinal tissues. The right L2 nerve tracks just above and lateral to the threading of the right L3 pedicle screw. Solid interbody fusion at L4-5. Posterior decompression from L2-3 through L4-5. Small right pleural effusion  with mild bibasilar atelectasis. Aortoiliac atherosclerotic vascular disease. Expected findings from recent operative intervention along the posterior subcutaneous tissues. No visible impingement at the L3-4 or L4-5 level, despite the intervertebral spurring in the right neural foramen at L4-5. No other impingement identified. IMPRESSION: 1. The right L3 pedicle screw captures the superolateral margin of the pedicle and abuts the lateral cortical margin of the vertebral body. The right L2 nerve skirts along the superior and lateral margin of the threatening of the screw, and could be conceivably irritated by the screw. Otherwise expected postoperative appearance. 2. Please note that as on prior exams, the L5 vertebral level is sacralized/transitional. Careful correlation with this numbering strategy, which is stable compared to the prior CT scan, is recommended. Radiology assistant personnel have been notified to put me in telephone contact with the referring physician or the referring physician's clinical representative in order to discuss these findings. Once this communication is established I will issue an addendum to this report for documentation purposes. Electronically Signed: By: Van Clines M.D. On: 04/13/2016 12:52   Dg Chest Port 1 View  04/14/2016  CLINICAL DATA:  Status post lumbar surgery 5 days ago. Hypertension. Congestive heart failure. EXAM: PORTABLE CHEST 1 VIEW COMPARISON:  PA and lateral chest 06/16/2015. FINDINGS: There is cardiomegaly without edema. The lungs are clear. No pneumothorax or pleural  effusion. Pacing device is noted. IMPRESSION: No acute disease. Electronically Signed   By: Inge Rise M.D.   On: 04/14/2016 14:16   Dg Abd 2 Views  04/12/2016  CLINICAL DATA:  Ileus. EXAM: ABDOMEN - 2 VIEW COMPARISON:  None. FINDINGS: Bowel gas pattern is nonobstructive. No convincing evidence of ileus. Gas seen to the level of the rectum. A left-sided drain is seen. No other acute abnormalities. Pedicle rods and screws overlie the lower lumbar spine. IMPRESSION: No definite ileus.  No acute abnormalities. Electronically Signed   By: Dorise Bullion III M.D   On: 04/12/2016 11:54   Dg C-arm 1-60 Min  04/16/2016  CLINICAL DATA:  Lumbar spine hardware revision. EXAM: DG C-ARM 61-120 MIN; LUMBAR SPINE - 2-3 VIEW COMPARISON:  Lumbar spine CT 04/13/2016. FINDINGS: Spot PA and lateral fluoroscopic images of the lumbar spine are submitted from the operating room. Numbering is based on the location of the pre-existing hardware at L3-4. These views demonstrate the placement of a right pedicle screw at L2. There are soft tissue retractors and sponges in the surgical bed. No interconnecting rod has been placed. IMPRESSION: Intraoperative views during extension of the lumbar fusion superiorly across the L2-3 disc space. Electronically Signed   By: Richardean Sale M.D.   On: 04/16/2016 17:50   Dg C-arm 1-60 Min  04/09/2016  CLINICAL DATA:  Post L3-L4 fusion EXAM: LUMBAR SPINE - 2-3 VIEW; DG C-ARM 61-120 MIN COMPARISON:  Lumbar spine CT 10/11/2015 FINDINGS: Posterior transpedicular screws noted I assume at L3-L4 level. There is a disc spacer I assume L3 L4 level. The previous metallic screw at L5 level is not identified. There is anatomic alignment. Please note the lower lumbar spine is not visualized therefore numbering levels is very difficult. IMPRESSION: Posterior transpedicular screws noted I assume at L3-L4 level. There is a disc spacer I assume L3 L4 level. The previous metallic screw at L5 level is not  identified. There is anatomic alignment. Please note the lower lumbar spine is not visualized therefore numbering levels is very difficult. Fluoroscopy time was 29 seconds.  Please see the operative report. These results were called  by telephone at the time of interpretation on 04/09/2016 at 12:49 pm to Dr. Kary Kos , who verbally acknowledged these results. Electronically Signed   By: Lahoma Crocker M.D.   On: 04/09/2016 12:50    Time Spent in minutes  25 minutes   Dajanae Brophy M.D PhD on 04/19/2016 at 9:13 AM  Between 7am to 7pm - Pager - [phone]  After 7pm go to [email or web address] - password [facility]  [facility] -  Office  [phone]

## 2016-04-19 NOTE — Progress Notes (Signed)
Patient ID: Stacie Roman, female   DOB: May 17, 1931, 82 y.o.   MRN: [id number] Subjective:  The patient is alert and pleasant. She says she can't walk. Her family's plan is to take her home in a few days.  Objective: Vital signs in last 24 hours: Temp:  [98 F (36.7 C)-98.7 F (37.1 C)] 98.7 F (37.1 C) (06/17 0541) Pulse Rate:  [58-63] 61 (06/17 0541) Resp:  [16-22] 18 (06/17 0541) BP: (153-183)/(39-50) 156/49 mmHg (06/17 0541) SpO2:  [92 %-97 %] 97 % (06/17 0541) Weight:  [100.744 kg (222 lb 1.6 oz)] 100.744 kg (222 lb 1.6 oz) (06/17 0541)  Intake/Output from previous day: 06/16 0701 - 06/17 0700 In: 360 [P.O.:360] Out: 2000 [Urine:2000] Intake/Output this shift:    Physical exam patient is alert and oriented. Her strength is grossly normal on her lower extremities.  Lab Results:  Recent Labs  04/18/16 0721 04/19/16 0530  WBC 8.9 7.8  HGB 8.6* 8.3*  HCT 27.0* 26.1*  PLT 272 295   BMET  Recent Labs  04/18/16 0721 04/19/16 0530  NA 136 133*  K 4.4 4.3  CL 105 101  CO2 23 22  GLUCOSE 109* 87  BUN 56* 59*  CREATININE 2.95* 2.84*  CALCIUM 8.4* 8.5*    Studies/Results: No results found.  Assessment/Plan: Postop day #10: We will continue to mobilize the patient with PT. She may go home next week.  LOS: 10 days     Daje Stark D 04/19/2016, 7:42 AM

## 2016-04-20 LAB — CBC
HCT: 26 % — ABNORMAL LOW (ref 36.0–46.0)
Hemoglobin: 8.5 g/dL — ABNORMAL LOW (ref 12.0–15.0)
MCH: 29 pg (ref 26.0–34.0)
MCHC: 32.7 g/dL (ref 30.0–36.0)
MCV: 88.7 fL (ref 78.0–100.0)
PLATELETS: 327 10*3/uL (ref 150–400)
RBC: 2.93 MIL/uL — ABNORMAL LOW (ref 3.87–5.11)
RDW: 15.4 % (ref 11.5–15.5)
WBC: 8.9 10*3/uL (ref 4.0–10.5)

## 2016-04-20 LAB — VITAMIN B12: VITAMIN B 12: 656 pg/mL (ref 180–914)

## 2016-04-20 LAB — BASIC METABOLIC PANEL
Anion gap: 10 (ref 5–15)
BUN: 64 mg/dL — AB (ref 6–20)
CALCIUM: 8.5 mg/dL — AB (ref 8.9–10.3)
CO2: 22 mmol/L (ref 22–32)
CREATININE: 3.02 mg/dL — AB (ref 0.44–1.00)
Chloride: 99 mmol/L — ABNORMAL LOW (ref 101–111)
GFR calc Af Amer: 16 mL/min — ABNORMAL LOW (ref >60)
GFR, EST NON AFRICAN AMERICAN: 13 mL/min — AB (ref >60)
Glucose, Bld: 91 mg/dL (ref 65–99)
Potassium: 4.2 mmol/L (ref 3.5–5.1)
SODIUM: 131 mmol/L — AB (ref 135–145)

## 2016-04-20 LAB — OCCULT BLOOD X 1 CARD TO LAB, STOOL: FECAL OCCULT BLD: NEGATIVE

## 2016-04-20 LAB — FOLATE: FOLATE: 7.2 ng/mL (ref >5.9)

## 2016-04-20 LAB — IRON AND TIBC
Iron: 26 ug/dL — ABNORMAL LOW (ref 28–170)
SATURATION RATIOS: 13 % (ref 10.4–31.8)
TIBC: 202 ug/dL — AB (ref 250–450)
UIBC: 176 ug/dL

## 2016-04-20 MED ORDER — MOMETASONE FURO-FORMOTEROL FUM 200-5 MCG/ACT IN AERO: 2.0000 | INHALATION_SPRAY | Freq: Two times a day (BID) | RESPIRATORY_TRACT | Status: AC

## 2016-04-20 MED ORDER — MOMETASONE FURO-FORMOTEROL FUM 200-5 MCG/ACT IN AERO
2.0000 | INHALATION_SPRAY | Freq: Two times a day (BID) | RESPIRATORY_TRACT | Status: DC
  Filled 2016-04-20: qty 8.8

## 2016-04-20 MED ORDER — CEPHALEXIN 500 MG PO CAPS
500.0000 mg | ORAL_CAPSULE | Freq: Two times a day (BID) | ORAL | Status: DC
  Administered 2016-04-20: 500 mg via ORAL
  Filled 2016-04-20: qty 1

## 2016-04-20 MED ORDER — GABAPENTIN 100 MG PO CAPS: 100.0000 mg | ORAL_CAPSULE | Freq: Two times a day (BID) | ORAL | Status: AC

## 2016-04-20 MED ORDER — CEPHALEXIN 500 MG PO CAPS: 500.0000 mg | ORAL_CAPSULE | Freq: Four times a day (QID) | ORAL | Status: DC

## 2016-04-20 NOTE — Progress Notes (Signed)
Pt for discharge home today. Discharge orders received. IV DCd with dressing clean dry and intact to lower back. Discharge instructions and prescriptions given with verbalized understanding. Family at bedside to assist with discharge. Family brought patient to lobby via wheelchair. Transported to home by family member.

## 2016-04-20 NOTE — Progress Notes (Signed)
Physical Therapy Treatment Patient Details Name: Stacie Roman MRN: [id number] DOB: October 21, 1931 Today's Date: 04/20/2016    History of Present Illness pt presents post L3-4 PLIF with hardware removal.  pt with hx of Pacemaker, CHF, CVA, HTN, CA, CKD, COPD, and back surgery.  Pt now s/p Exploration of lumbar fusion removal of right lumbar three pedicle screw, replacement of right lumbar two pedicle screw, non segmental lumbar two to lumbar four fixation 6/14.     PT Comments    Pt performed increased mobility.  Pt remains to require +2 assist of mod for transfers and gait training.  Pt required total assist +2 for sit to supine.  Pt reports intolerable pain and family is refusing placement despite need for more aggressive PT before return home.     Follow Up Recommendations  SNF;Supervision/Assistance - 24 hour (family refusing SNF and set on taking patient home.  )     Equipment Recommendations  None recommended by PT    Recommendations for Other Services       Precautions / Restrictions Precautions Precautions: Fall;Back Precaution Comments: no recall of precautions Required Braces or Orthoses: Spinal Brace Spinal Brace: Lumbar corset;Applied in sitting position Restrictions Weight Bearing Restrictions: No    Mobility  Bed Mobility Overal bed mobility: Needs Assistance Bed Mobility: Sit to Supine       Sit to supine: Total assist;+2 for physical assistance   General bed mobility comments: Pt performed via flat spin as family refuses to allow PTA to perform log rolling technique.    Transfers Overall transfer level: Needs assistance Equipment used: Rolling walker (2 wheeled) Transfers: Sit to/from Stand Sit to Stand: Mod assist;+2 physical assistance Stand pivot transfers: Mod assist;+2 physical assistance       General transfer comment: Pt performed sit to stand from recliner x2.  Pt transferred back to bed after 2nd gait trial.  Pt performed with cues for hand  placement, forward weight shifting and weight bearing to achieve standing.  Pt daughters hinders patient's progress by pulling on patient.  Family not receptive to teaching.    Ambulation/Gait Ambulation/Gait assistance: Mod assist;+2 physical assistance Ambulation Distance (Feet): 8 Feet (x2 trials.  ) Assistive device: Rolling walker (2 wheeled) Gait Pattern/deviations: Step-to pattern;Decreased stride length;Trunk flexed;Wide base of support;Shuffle Gait velocity: decreased   General Gait Details: Cues for sequencing and RW safety.  Pt performed step to pattern with cues for constant encouragment.  Pt fatigues quickly and required max cues for safety.     Stairs            Wheelchair Mobility    Modified Rankin (Stroke Patients Only)       Balance Overall balance assessment: Needs assistance Sitting-balance support: Feet supported;Bilateral upper extremity supported Sitting balance-Leahy Scale: Poor Sitting balance - Comments: Able to sit EOB with BUE support with feet on floor.      Standing balance-Leahy Scale: Poor                      Cognition Arousal/Alertness: Awake/alert Behavior During Therapy: Anxious Overall Cognitive Status: Impaired/Different from baseline Area of Impairment: Memory;Safety/judgement     Memory: Decreased recall of precautions   Safety/Judgement: Decreased awareness of safety;Decreased awareness of deficits Awareness: Intellectual Problem Solving: Requires verbal cues;Difficulty sequencing;Decreased initiation;Slow processing (Family talks over therapist which limited progress.  ) General Comments: pt grabbing at therapist even after education on hand placement and use of RW    Exercises  General Comments        Pertinent Vitals/Pain Pain Assessment: 0-10 Pain Score: 10-Worst pain ever Faces Pain Scale: Hurts worst Pain Location: Back at incision, reports weak legs.   Pain Descriptors / Indicators:  Discomfort;Grimacing;Guarding;Moaning;Sore Pain Intervention(s): Monitored during session;Repositioned    Home Living                      Prior Function            PT Goals (current goals can now be found in the care plan section) Acute Rehab PT Goals Patient Stated Goal: none stated Potential to Achieve Goals: Fair Progress towards PT goals: Progressing toward goals    Frequency  Min 5X/week    PT Plan Current plan remains appropriate    Co-evaluation             End of Session Equipment Utilized During Treatment: Gait belt;Back brace;Oxygen Activity Tolerance: Patient limited by pain Patient left: in bed;with call bell/phone within reach;with bed alarm set;with SCD's reapplied;with family/visitor present     Time: [id number] PT Time Calculation (min) (ACUTE ONLY): 19 min  Charges:  $Therapeutic Activity: 8-22 mins                    G Codes:      Stacie Roman 05-07-16, 12:46 PM Stacie Roman, PTA pager [phone]

## 2016-04-20 NOTE — Progress Notes (Signed)
PROGRESS NOTE                                                                                                                                                                                                             Patient Demographics:    Stacie Roman, is a 82 y.o. female, DOB - 11-21-1930, [id number]  Admit date - 04/09/2016   Admitting Physician Kary Kos, MD  Outpatient Primary MD for the patient is [name], MD  LOS - 11   No chief complaint on file.      Brief Narrative  82 y.o. female with past medical history significant for CHF with cardiac pacemaker, CVA, HTN, hypothyroidism, GERD, frequent nosebleeds, CJD, COPD who presented to [facility] on 04/09/2016 for elective L4-L5 fusion , Hospitalist service consulted for post operative anemia, and worsening renal function.   Subjective:    Stacie Roman post op day three, feeling better, no bm, requesting enema, son in room,     Assessment  & Plan :    Active Problems:   Spondylolisthesis at [id number] level   Diastolic CHF (HCC)   Normocytic anemia   AKI (acute kidney injury) ([place])   CKD (chronic kidney disease) stage 4, GFR 15-29 ml/min (HCC)   Weakness   Spondylosis of lumbar joint   Anemia - Hgb 5.9, baseline 10. Likely from combination of operative loss, Worsening renal function, and GI. Normocytic. Per family, this has happened w/ previous surgical procedures - FOBT pending - Transfused 2 units PRBC 6/12, hemoglobin improved, today is 8.7, monitor closely and transfuse as needed - iron studies and anemia panel not likely beneficial at this point as PRBC already started.  - hgb stable at discharge  Acute on chronic kidney disease stage III - Baseline creatinine 2.5, peaked at 4.3, most likely due to prerenal giving anemia and diuretic use - Will give gentle hydration today giving known history of diastolic CHF, and will hold torsemide, difficult function at  baseline tomorrow, resume torsemide at a lower dose - cr improving,  restarted home diuretic on [id number]  Chronic diastolic CHF - Vehicle showing EF [id number], grade 2 diastolic dysfunction. - Currently appears to be dry, s/p gentle hydration, her renal function has improved,   resume torsemide 6/16  HTN - Continue with home medication,   Hypothyroidism - Continue with levothyroxine  tsh 4.6 ,  increase levo to 32mcg daily -pmd to continue to monitor tsh  COPD on home oxygen - Continue with nebs as needed  Pain control: continue current regimen, reported combination of tylenol and neurontin works well for her pain, will increase neurontin to 100mg  bid, need to monitor cr level.  Constipation: stool softener, enema   Lab Results  Component Value Date   PLT 327 04/20/2016    Antibiotics  :    Anti-infectives    Start     Dose/Rate Route Frequency Ordered Stop   04/20/16 1145  cephALEXin (KEFLEX) capsule 500 mg     500 mg Oral Every 12 hours 04/20/16 1131     04/20/16 0000  cephALEXin (KEFLEX) 500 MG capsule     500 mg Oral 4 times daily 04/20/16 1136     04/17/16 0400  ceFAZolin (ANCEF) IVPB 2g/100 mL premix     2 g 200 mL/hr over 30 Minutes Intravenous Every 12 hours 04/17/16 0330 04/18/16 1543   04/16/16 1503  vancomycin (VANCOCIN) powder  Status:  Discontinued       As needed 04/16/16 1503 04/16/16 1600   04/16/16 1502  bacitracin 50,000 Units in sodium chloride irrigation 0.9 % 500 mL irrigation  Status:  Discontinued       As needed 04/16/16 1502 04/16/16 1600   04/16/16 1448  vancomycin (VANCOCIN) 1000 MG powder    Comments:  Key, Jennifer   : cabinet override      04/16/16 1448 04/17/16 0259   04/16/16 1404  ceFAZolin (ANCEF) 2-4 GM/100ML-% IVPB    Comments:  Cato, Sarah   : cabinet override      04/16/16 1404 04/16/16 1416   04/09/16 2100  ceFAZolin (ANCEF) IVPB 1 g/50 mL premix     1 g 100 mL/hr over 30 Minutes Intravenous Every 12 hours 04/09/16 1539 04/11/16 1029    04/09/16 1539  acyclovir (ZOVIRAX) tablet 800 mg  Status:  Discontinued     800 mg Oral Every 8 hours PRN 04/09/16 1539 04/15/16 1359   04/09/16 1154  vancomycin (VANCOCIN) powder  Status:  Discontinued       As needed 04/09/16 1154 04/09/16 1231   04/09/16 1149  vancomycin (VANCOCIN) 1000 MG powder    Comments:  Atilano Median   : cabinet override      04/09/16 1149 04/09/16 2359   04/09/16 1005  50,000 units bacitracin in 0.9% normal saline 250 mL irrigation  Status:  Discontinued       As needed 04/09/16 1005 04/09/16 1231   04/09/16 0600  ceFAZolin (ANCEF) IVPB 2g/100 mL premix     2 g 200 mL/hr over 30 Minutes Intravenous To [facility] 04/08/16 1239 04/09/16 0855        Objective:   Filed Vitals:   04/20/16 0500 04/20/16 0537 04/20/16 0903 04/20/16 1037  BP:  173/43  178/45  Pulse:  61  64  Temp:  97.9 F (36.6 C)  97.8 F (36.6 C)  TempSrc:  Oral  Oral  Resp:  18  20  Height:      Weight: 99.066 kg (218 lb 6.4 oz)     SpO2:  98% 94% 95%    Wt Readings from Last 3 Encounters:  04/20/16 99.066 kg (218 lb 6.4 oz)  04/02/16 87.573 kg (193 lb 1 oz)    No intake or output data in the 24 hours ending 04/20/16 1228   Physical Exam  Awake Alert, Oriented X 3,  [place].AT,PERRAL Supple Neck,No JVD, No cervical lymphadenopathy appriciated.  Symmetrical Chest wall movement, Good air movement bilaterally, CTAB RRR,No Gallops,Rubs or new Murmurs, No Parasternal Heave +ve B.Sounds, Abd Soft, No tenderness,  No Cyanosis, Clubbing or edema, No new Rash or bruise      Data Review:    CBC  Recent Labs Lab 04/16/16 1638 04/17/16 0526 04/18/16 0721 04/19/16 0530 04/20/16 0443  WBC 10.5 10.3 8.9 7.8 8.9  HGB 7.8* 9.0* 8.6* 8.3* 8.5*  HCT 24.5* 27.4* 27.0* 26.1* 26.0*  PLT 265 248 272 295 327  MCV 89.1 87.8 88.8 89.4 88.7  MCH 28.4 28.8 28.3 28.4 29.0  MCHC 31.8 32.8 31.9 31.8 32.7  RDW 16.4* 17.0* 16.5* 16.0* 15.4  LYMPHSABS  --   --   --  1.5  --   MONOABS   --   --   --  1.4*  --   EOSABS  --   --   --  0.4  --   BASOSABS  --   --   --  0.0  --     Chemistries   Recent Labs Lab 04/14/16 0941  04/16/16 0522 04/17/16 0526 04/18/16 0721 04/19/16 0530 04/20/16 0443  NA 131*  < > 133* 136 136 133* 131*  K 3.7  < > 4.4 4.7 4.4 4.3 4.2  CL 98*  < > 101 103 105 101 99*  CO2 20*  < > 23 20* 23 22 22   GLUCOSE 171*  < > 118* 98 109* 87 91  BUN 61*  < > 63* 57* 56* 59* 64*  CREATININE 4.36*  < > 3.31* 2.92* 2.95* 2.84* 3.02*  CALCIUM 8.0*  < > 8.3* 8.3* 8.4* 8.5* 8.5*  AST 12*  --   --   --   --   --   --   ALT <5*  --   --   --   --   --   --   ALKPHOS 73  --   --   --   --   --   --   BILITOT 0.5  --   --   --   --   --   --   < > = values in this interval not displayed. ------------------------------------------------------------------------------------------------------------------ No results for input(s): CHOL, HDL, LDLCALC, TRIG, CHOLHDL, LDLDIRECT in the last 72 hours.  No results found for: HGBA1C ------------------------------------------------------------------------------------------------------------------  Recent Labs  04/18/16 0721  TSH 4.688*   ------------------------------------------------------------------------------------------------------------------  Recent Labs  04/20/16 0443  VITAMINB12 656  FOLATE 7.2  TIBC 202*  IRON 26*    Coagulation profile  Recent Labs Lab 04/19/16 0530  INR 1.21    No results for input(s): DDIMER in the last 72 hours.  Cardiac Enzymes No results for input(s): CKMB, TROPONINI, MYOGLOBIN in the last 168 hours.  Invalid input(s): CK ------------------------------------------------------------------------------------------------------------------ No results found for: BNP  Inpatient Medications  Scheduled Meds: . aspirin EC  81 mg Oral BH-q7a  . bisacodyl  10 mg Oral QHS  . bisacodyl  5 mg Oral Daily  . bupivacaine liposome  20 mL Infiltration Once  . cephALEXin   500 mg Oral Q12H  . cholecalciferol  5,000 Units Oral BH-q7a  . cloNIDine  0.1 mg Oral BID  . docusate sodium  200 mg Oral BID  . gabapentin  100 mg Oral BID  . ipratropium-albuterol  3 mL Nebulization Daily  . isosorbide mononitrate  90 mg Oral q1800  . labetalol  400 mg Oral TID  .  levothyroxine  75 mcg Oral QAC breakfast  . loratadine  10 mg Oral BH-q7a  . mineral oil  1 enema Rectal Once  . mometasone-formoterol  2 puff Inhalation BID  . NIFEdipine  60 mg Oral BID  . oxybutynin  10 mg Oral q1800  . polyethylene glycol  17 g Oral Daily  . senna-docusate  2 tablet Oral QHS  . sodium chloride flush  3 mL Intravenous Q12H  . torsemide  40 mg Oral Daily   Continuous Infusions: . sodium chloride     PRN Meds:.acetaminophen **OR** acetaminophen, acetaminophen-codeine, albuterol, bisacodyl, cyclobenzaprine, diphenhydrAMINE **OR** diphenhydrAMINE, HYDROmorphone (DILAUDID) injection, menthol-cetylpyridinium **OR** phenol, morphine injection, ondansetron (ZOFRAN) IV, sodium chloride flush  Micro Results No results found for this or any previous visit (from the past 240 hour(s)).  Radiology Reports Dg Lumbar Spine 2-3 Views  04/16/2016  CLINICAL DATA:  Lumbar spine hardware revision. EXAM: DG C-ARM 61-120 MIN; LUMBAR SPINE - 2-3 VIEW COMPARISON:  Lumbar spine CT 04/13/2016. FINDINGS: Spot PA and lateral fluoroscopic images of the lumbar spine are submitted from the operating room. Numbering is based on the location of the pre-existing hardware at L3-4. These views demonstrate the placement of a right pedicle screw at L2. There are soft tissue retractors and sponges in the surgical bed. No interconnecting rod has been placed. IMPRESSION: Intraoperative views during extension of the lumbar fusion superiorly across the L2-3 disc space. Electronically Signed   By: Richardean Sale M.D.   On: 04/16/2016 17:50   Dg Lumbar Spine 2-3 Views  04/09/2016  CLINICAL DATA:  Post L3-L4 fusion EXAM: LUMBAR  SPINE - 2-3 VIEW; DG C-ARM 61-120 MIN COMPARISON:  Lumbar spine CT 10/11/2015 FINDINGS: Posterior transpedicular screws noted I assume at L3-L4 level. There is a disc spacer I assume L3 L4 level. The previous metallic screw at L5 level is not identified. There is anatomic alignment. Please note the lower lumbar spine is not visualized therefore numbering levels is very difficult. IMPRESSION: Posterior transpedicular screws noted I assume at L3-L4 level. There is a disc spacer I assume L3 L4 level. The previous metallic screw at L5 level is not identified. There is anatomic alignment. Please note the lower lumbar spine is not visualized therefore numbering levels is very difficult. Fluoroscopy time was 29 seconds.  Please see the operative report. These results were called by telephone at the time of interpretation on 04/09/2016 at 12:49 pm to Dr. Kary Kos , who verbally acknowledged these results. Electronically Signed   By: Lahoma Crocker M.D.   On: 04/09/2016 12:50   Ct Lumbar Spine Wo Contrast  04/13/2016  ADDENDUM REPORT: 04/13/2016 13:32 ADDENDUM: The original report was by Dr. Van Clines. The following addendum is by Dr. Van Clines: These results were called by telephone at the time of interpretation on 04/13/2016 at 1:28 pm to Dr. Ashok Pall , who verbally acknowledged these results. Electronically Signed   By: Van Clines M.D.   On: 04/13/2016 13:32  04/13/2016  CLINICAL DATA:  Hardware placement. Weakness. Back pain common discomfort. Recent L3-4 fusion 04/09/2016 EXAM: CT LUMBAR SPINE WITHOUT CONTRAST TECHNIQUE: Multidetector CT imaging of the lumbar spine was performed without intravenous contrast administration. Multiplanar CT image reconstructions were also generated. COMPARISON:  Multiple exams, including 04/09/2016 and 10/11/2015 FINDINGS: Partially sacralized L5 vertebra noted. This numbering is consistent with the recent prior CT of 10/11/2015. Compared to 10/11/2015, the L4-5  posterolateral rod and pedicle screw fixation has been removed and there has been placement of  posterolateral rod and pedicle screw fixators at L3-4. Interbody spacer noted at L3-4. There was previously 6 mm anterolisthesis at L3-4 and this has not changed. The right L3 pedicle screw skirts the superolateral border of the pedicle and tracks just lateral to the cortical margin of the L3 vertebral body in the paraspinal tissues. The right L2 nerve tracks just above and lateral to the threading of the right L3 pedicle screw. Solid interbody fusion at L4-5. Posterior decompression from L2-3 through L4-5. Small right pleural effusion with mild bibasilar atelectasis. Aortoiliac atherosclerotic vascular disease. Expected findings from recent operative intervention along the posterior subcutaneous tissues. No visible impingement at the L3-4 or L4-5 level, despite the intervertebral spurring in the right neural foramen at L4-5. No other impingement identified. IMPRESSION: 1. The right L3 pedicle screw captures the superolateral margin of the pedicle and abuts the lateral cortical margin of the vertebral body. The right L2 nerve skirts along the superior and lateral margin of the threatening of the screw, and could be conceivably irritated by the screw. Otherwise expected postoperative appearance. 2. Please note that as on prior exams, the L5 vertebral level is sacralized/transitional. Careful correlation with this numbering strategy, which is stable compared to the prior CT scan, is recommended. Radiology assistant personnel have been notified to put me in telephone contact with the referring physician or the referring physician's clinical representative in order to discuss these findings. Once this communication is established I will issue an addendum to this report for documentation purposes. Electronically Signed: By: Van Clines M.D. On: 04/13/2016 12:52   Dg Chest Port 1 View  04/14/2016  CLINICAL DATA:   Status post lumbar surgery 5 days ago. Hypertension. Congestive heart failure. EXAM: PORTABLE CHEST 1 VIEW COMPARISON:  PA and lateral chest 06/16/2015. FINDINGS: There is cardiomegaly without edema. The lungs are clear. No pneumothorax or pleural effusion. Pacing device is noted. IMPRESSION: No acute disease. Electronically Signed   By: Inge Rise M.D.   On: 04/14/2016 14:16   Dg Abd 2 Views  04/12/2016  CLINICAL DATA:  Ileus. EXAM: ABDOMEN - 2 VIEW COMPARISON:  None. FINDINGS: Bowel gas pattern is nonobstructive. No convincing evidence of ileus. Gas seen to the level of the rectum. A left-sided drain is seen. No other acute abnormalities. Pedicle rods and screws overlie the lower lumbar spine. IMPRESSION: No definite ileus.  No acute abnormalities. Electronically Signed   By: Dorise Bullion III M.D   On: 04/12/2016 11:54   Dg C-arm 1-60 Min  04/16/2016  CLINICAL DATA:  Lumbar spine hardware revision. EXAM: DG C-ARM 61-120 MIN; LUMBAR SPINE - 2-3 VIEW COMPARISON:  Lumbar spine CT 04/13/2016. FINDINGS: Spot PA and lateral fluoroscopic images of the lumbar spine are submitted from the operating room. Numbering is based on the location of the pre-existing hardware at L3-4. These views demonstrate the placement of a right pedicle screw at L2. There are soft tissue retractors and sponges in the surgical bed. No interconnecting rod has been placed. IMPRESSION: Intraoperative views during extension of the lumbar fusion superiorly across the L2-3 disc space. Electronically Signed   By: Richardean Sale M.D.   On: 04/16/2016 17:50   Dg C-arm 1-60 Min  04/09/2016  CLINICAL DATA:  Post L3-L4 fusion EXAM: LUMBAR SPINE - 2-3 VIEW; DG C-ARM 61-120 MIN COMPARISON:  Lumbar spine CT 10/11/2015 FINDINGS: Posterior transpedicular screws noted I assume at L3-L4 level. There is a disc spacer I assume L3 L4 level. The previous metallic screw at L5  level is not identified. There is anatomic alignment. Please note the lower  lumbar spine is not visualized therefore numbering levels is very difficult. IMPRESSION: Posterior transpedicular screws noted I assume at L3-L4 level. There is a disc spacer I assume L3 L4 level. The previous metallic screw at L5 level is not identified. There is anatomic alignment. Please note the lower lumbar spine is not visualized therefore numbering levels is very difficult. Fluoroscopy time was 29 seconds.  Please see the operative report. These results were called by telephone at the time of interpretation on 04/09/2016 at 12:49 pm to Dr. Kary Kos , who verbally acknowledged these results. Electronically Signed   By: Lahoma Crocker M.D.   On: 04/09/2016 12:50    Time Spent in minutes  25 minutes   Keirstyn Aydt M.D PhD on 04/20/2016 at 12:28 PM  Between 7am to 7pm - Pager - [phone]  After 7pm go to [email or web address] - password [facility]  [facility] -  Office  [phone]

## 2016-04-20 NOTE — Discharge Summary (Signed)
Physician Discharge Summary  Patient ID: Stacie Roman MRN: [id number] DOB/AGE: September 27, 1931 82 y.o.  Admit date: 04/09/2016 Discharge date: 04/20/2016  Admission Diagnoses:Lumbar spondylolisthesis, malplaced pedicle screw, lumbago  Discharge Diagnoses: The same Active Problems:   Spondylolisthesis at XX123456 level   Diastolic CHF (HCC)   Normocytic anemia   AKI (acute kidney injury) ([place])   CKD (chronic kidney disease) stage 4, GFR 15-29 ml/min (HCC)   Weakness   Spondylosis of lumbar joint   Discharged Condition: fair  Hospital Course: Dr. Saintclair Halsted performed a revision of the patient's lumbar instrumentation on 04/09/2013 and 04/16/2013. Postoperatively the patient had significant anemia and was transfused. The patient was started on iron and her most recent hemoglobin had increased to 8.5. She is presently asymptomatic.  The patient developed mild drainage from her wound. A dressing was reapplied. Presently it is dry. She was started empirically on Keflex.  The patient has been slow to mobilize. The patient did not want to go to skilled nursing facility and arrangements have been made to take her home where she will get home physical therapy. The patient and her family requested discharge on 04/20/2016. They were given written and oral discharge instructions. All her questions were answered.  Consults: Physical therapy, internal medicine Significant Diagnostic Studies: Lumbar CT Treatments: Revision of lumbar fusion on 04/09/2016 and 04/16/2016.  Discharge Exam: Blood pressure 178/45, pulse 64, temperature 97.8 F (36.6 C), temperature source Oral, resp. rate 20, height 5\' 2"  (1.575 m), weight 99.066 kg (218 lb 6.4 oz), SpO2 95 %. The patient is alert and pleasant. She is moving her lower extremities well. Her dressing is clean and dry.  Disposition: Home  Discharge Instructions    Call MD for:  difficulty breathing, headache or visual disturbances    Complete by:  As directed       Call MD for:  extreme fatigue    Complete by:  As directed      Call MD for:  hives    Complete by:  As directed      Call MD for:  persistant dizziness or light-headedness    Complete by:  As directed      Call MD for:  persistant nausea and vomiting    Complete by:  As directed      Call MD for:  redness, tenderness, or signs of infection (pain, swelling, redness, odor or green/yellow discharge around incision site)    Complete by:  As directed      Call MD for:  severe uncontrolled pain    Complete by:  As directed      Call MD for:  temperature >100.4    Complete by:  As directed      Diet - low sodium heart healthy    Complete by:  As directed      Discharge instructions    Complete by:  As directed   Call [phone] for a followup appointment. Take a stool softener while you are using pain medications.     Driving Restrictions    Complete by:  As directed   Do not drive for 2 weeks.     Increase activity slowly    Complete by:  As directed      Lifting restrictions    Complete by:  As directed   Do not lift more than 5 pounds. No excessive bending or twisting.     May shower / Bathe    Complete by:  As directed   He may  shower after the pain she is removed 3 days after surgery. Leave the incision alone.     Remove dressing in 48 hours    Complete by:  As directed   Your stitches are under the scan and will dissolve by themselves. The Steri-Strips will fall off after you take a few showers. Do not rub back or pick at the wound, Leave the wound alone.            Medication List    TAKE these medications        acetaminophen 500 MG tablet  Commonly known as:  TYLENOL  Take 1,000 mg by mouth every morning.     acyclovir 400 MG tablet  Commonly known as:  ZOVIRAX  Take 800 mg by mouth every 8 (eight) hours as needed (for canker sores).     aspirin EC 81 MG tablet  Take 81 mg by mouth every morning.     bisacodyl 5 MG EC tablet  Commonly known as:  DULCOLAX   Take 5-10 mg by mouth 2 (two) times daily. 5 mg every morning and 10 mg every evening or 15 mg in the evening if needed for moderate constipation     cephALEXin 500 MG capsule  Commonly known as:  KEFLEX  Take 1 capsule (500 mg total) by mouth 4 (four) times daily.     cloNIDine 0.1 MG tablet  Commonly known as:  CATAPRES  Take 0.1 mg by mouth 2 (two) times daily.     docusate sodium 100 MG capsule  Commonly known as:  COLACE  Take 200 mg by mouth 2 (two) times daily.     DULERA 200-5 MCG/ACT Aero  Generic drug:  mometasone-formoterol  2 puffs 2 (two) times daily.     gabapentin 100 MG capsule  Commonly known as:  NEURONTIN  Take 1 capsule (100 mg total) by mouth 2 (two) times daily.     isosorbide mononitrate 60 MG 24 hr tablet  Commonly known as:  IMDUR  Take 60 mg by mouth daily at 6 PM. Take with 30 mg tablet to = 90 mg daily     isosorbide mononitrate 30 MG 24 hr tablet  Commonly known as:  IMDUR  Take 30 mg by mouth daily at 6 PM. Take with 60 mg tablet to = 90 mg daily     labetalol 200 MG tablet  Commonly known as:  NORMODYNE  Take 400 mg by mouth 3 (three) times daily.     levothyroxine 50 MCG tablet  Commonly known as:  SYNTHROID, LEVOTHROID  Take 50 mcg by mouth daily before breakfast.     loratadine 10 MG tablet  Commonly known as:  CLARITIN  Take 10 mg by mouth every morning.     NIFEdipine 60 MG 24 hr tablet  Commonly known as:  PROCARDIA XL/ADALAT-CC  Take 60 mg by mouth 2 (two) times daily.     OSTEO BI-FLEX ADV JOINT SHIELD Tabs  Take 2 tablets by mouth every morning.     oxybutynin 10 MG 24 hr tablet  Commonly known as:  DITROPAN-XL  Take 10 mg by mouth daily at 6 PM.     OXYGEN  Inhale 2 L into the lungs continuous. 2 L through the night as needed during the day     SALMON OIL-1000 PO  Take 1,000 mg by mouth.     torsemide 20 MG tablet  Commonly known as:  DEMADEX  Take 40 mg by mouth every  morning.     VENTOLIN HFA 108 (90 Base)  MCG/ACT inhaler  Generic drug:  albuterol  Inhale 2 puffs into the lungs every 6 (six) hours as needed for wheezing or shortness of breath.     Vitamin D3 5000 units Caps  Take 5,000 Units by mouth every morning.         SignedOphelia Charter 04/20/2016, 11:36 AM

## 2016-04-21 NOTE — Care Management Note (Signed)
Case Management Note  Patient Details  Name: Stacie Roman MRN: [id number] Date of Birth: 1931/09/12  Subjective/Objective:                    Action/Plan: 04/21/2016 @ 0900: Pt discharged yesterday with orders for [facility] PT. CM called pts son this am and verified that they would like to use [facility] as previously voiced. CM called Tiffany with [facility] and informed her and she accepted the referral. [facility] to follow.   Expected Discharge Date:                  Expected Discharge Plan:     In-House Referral:     Discharge planning Services     Post Acute Care Choice:    Choice offered to:     DME Arranged:    DME Agency:     HH Arranged:    HH Agency:     Status of Service:  In process, will continue to follow  Medicare Important Message Given:  Yes Date Medicare IM Given:    Medicare IM give by:    Date Additional Medicare IM Given:    Additional Medicare Important Message give by:     If discussed at [place] of Stay Meetings, dates discussed:    Additional Comments:  Pollie Friar, RN 04/21/2016, 9:18 AM

## 2016-04-30 ENCOUNTER — Other Ambulatory Visit: Payer: Self-pay

## 2016-04-30 ENCOUNTER — Ambulatory Visit (HOSPITAL_COMMUNITY)
Admission: RE | Admit: 2016-04-30 | Discharge: 2016-04-30 | Disposition: A | Discharge status: Home / Self Care | Payer: Medicare HMO | Source: Ambulatory Visit | Attending: Vascular Surgery | Admitting: Vascular Surgery

## 2016-04-30 DIAGNOSIS — K219 Gastro-esophageal reflux disease without esophagitis: Secondary | ICD-10-CM | POA: Diagnosis not present

## 2016-04-30 DIAGNOSIS — M7989 Other specified soft tissue disorders: Secondary | ICD-10-CM | POA: Insufficient documentation

## 2016-04-30 DIAGNOSIS — I509 Heart failure, unspecified: Secondary | ICD-10-CM | POA: Diagnosis not present

## 2016-04-30 DIAGNOSIS — I130 Hypertensive heart and chronic kidney disease with heart failure and stage 1 through stage 4 chronic kidney disease, or unspecified chronic kidney disease: Secondary | ICD-10-CM | POA: Diagnosis not present

## 2016-04-30 DIAGNOSIS — J449 Chronic obstructive pulmonary disease, unspecified: Secondary | ICD-10-CM | POA: Diagnosis not present

## 2016-04-30 DIAGNOSIS — N184 Chronic kidney disease, stage 4 (severe): Secondary | ICD-10-CM | POA: Insufficient documentation

## 2016-07-25 ENCOUNTER — Ambulatory Visit (HOSPITAL_COMMUNITY): Payer: Medicare HMO

## 2016-07-28 ENCOUNTER — Other Ambulatory Visit (HOSPITAL_COMMUNITY): Payer: Self-pay | Admitting: Neurosurgery

## 2016-07-28 ENCOUNTER — Ambulatory Visit (HOSPITAL_COMMUNITY)
Admission: RE | Admit: 2016-07-28 | Discharge: 2016-07-28 | Disposition: A | Discharge status: Home / Self Care | Payer: Medicare HMO | Source: Ambulatory Visit | Attending: Neurosurgery | Admitting: Neurosurgery

## 2016-07-28 DIAGNOSIS — I739 Peripheral vascular disease, unspecified: Secondary | ICD-10-CM

## 2016-07-28 DIAGNOSIS — I771 Stricture of artery: Secondary | ICD-10-CM | POA: Diagnosis not present

## 2016-12-17 ENCOUNTER — Other Ambulatory Visit: Payer: Self-pay | Admitting: Neurosurgery

## 2016-12-17 DIAGNOSIS — M5137 Other intervertebral disc degeneration, lumbosacral region: Secondary | ICD-10-CM

## 2017-01-06 ENCOUNTER — Ambulatory Visit
Admission: RE | Admit: 2017-01-06 | Discharge: 2017-01-06 | Disposition: A | Discharge status: Home / Self Care | Payer: Medicare HMO | Source: Ambulatory Visit | Attending: Neurosurgery | Admitting: Neurosurgery

## 2017-01-06 DIAGNOSIS — M5137 Other intervertebral disc degeneration, lumbosacral region: Secondary | ICD-10-CM

## 2018-03-10 ENCOUNTER — Encounter (HOSPITAL_COMMUNITY): Payer: Self-pay | Admitting: *Deleted

## 2018-03-10 ENCOUNTER — Emergency Department (HOSPITAL_COMMUNITY): Payer: Medicare HMO

## 2018-03-10 ENCOUNTER — Inpatient Hospital Stay (HOSPITAL_COMMUNITY)
Admission: EM | Admit: 2018-03-10 | Discharge: 2018-03-13 | DRG: 291 | Disposition: A | Discharge status: Home Health | Payer: Medicare HMO | Attending: Internal Medicine | Admitting: Internal Medicine

## 2018-03-10 ENCOUNTER — Other Ambulatory Visit: Payer: Self-pay

## 2018-03-10 DIAGNOSIS — Z9841 Cataract extraction status, right eye: Secondary | ICD-10-CM

## 2018-03-10 DIAGNOSIS — I2510 Atherosclerotic heart disease of native coronary artery without angina pectoris: Secondary | ICD-10-CM | POA: Diagnosis not present

## 2018-03-10 DIAGNOSIS — M1990 Unspecified osteoarthritis, unspecified site: Secondary | ICD-10-CM | POA: Diagnosis present

## 2018-03-10 DIAGNOSIS — R7989 Other specified abnormal findings of blood chemistry: Secondary | ICD-10-CM

## 2018-03-10 DIAGNOSIS — R1310 Dysphagia, unspecified: Secondary | ICD-10-CM | POA: Diagnosis not present

## 2018-03-10 DIAGNOSIS — E039 Hypothyroidism, unspecified: Secondary | ICD-10-CM | POA: Diagnosis present

## 2018-03-10 DIAGNOSIS — Z825 Family history of asthma and other chronic lower respiratory diseases: Secondary | ICD-10-CM

## 2018-03-10 DIAGNOSIS — Z886 Allergy status to analgesic agent status: Secondary | ICD-10-CM | POA: Diagnosis not present

## 2018-03-10 DIAGNOSIS — J9601 Acute respiratory failure with hypoxia: Secondary | ICD-10-CM

## 2018-03-10 DIAGNOSIS — J9691 Respiratory failure, unspecified with hypoxia: Secondary | ICD-10-CM

## 2018-03-10 DIAGNOSIS — Z992 Dependence on renal dialysis: Secondary | ICD-10-CM

## 2018-03-10 DIAGNOSIS — D631 Anemia in chronic kidney disease: Secondary | ICD-10-CM | POA: Diagnosis present

## 2018-03-10 DIAGNOSIS — L89151 Pressure ulcer of sacral region, stage 1: Secondary | ICD-10-CM | POA: Diagnosis present

## 2018-03-10 DIAGNOSIS — J9621 Acute and chronic respiratory failure with hypoxia: Secondary | ICD-10-CM

## 2018-03-10 DIAGNOSIS — J189 Pneumonia, unspecified organism: Secondary | ICD-10-CM | POA: Diagnosis present

## 2018-03-10 DIAGNOSIS — Z90710 Acquired absence of both cervix and uterus: Secondary | ICD-10-CM | POA: Diagnosis not present

## 2018-03-10 DIAGNOSIS — I4891 Unspecified atrial fibrillation: Secondary | ICD-10-CM | POA: Diagnosis not present

## 2018-03-10 DIAGNOSIS — Z79890 Hormone replacement therapy (postmenopausal): Secondary | ICD-10-CM | POA: Diagnosis not present

## 2018-03-10 DIAGNOSIS — Z885 Allergy status to narcotic agent status: Secondary | ICD-10-CM | POA: Diagnosis not present

## 2018-03-10 DIAGNOSIS — K219 Gastro-esophageal reflux disease without esophagitis: Secondary | ICD-10-CM | POA: Diagnosis present

## 2018-03-10 DIAGNOSIS — J439 Emphysema, unspecified: Secondary | ICD-10-CM | POA: Diagnosis present

## 2018-03-10 DIAGNOSIS — Z8673 Personal history of transient ischemic attack (TIA), and cerebral infarction without residual deficits: Secondary | ICD-10-CM | POA: Diagnosis not present

## 2018-03-10 DIAGNOSIS — R042 Hemoptysis: Secondary | ICD-10-CM | POA: Diagnosis present

## 2018-03-10 DIAGNOSIS — T380X5A Adverse effect of glucocorticoids and synthetic analogues, initial encounter: Secondary | ICD-10-CM | POA: Diagnosis not present

## 2018-03-10 DIAGNOSIS — B370 Candidal stomatitis: Secondary | ICD-10-CM | POA: Diagnosis present

## 2018-03-10 DIAGNOSIS — Z7982 Long term (current) use of aspirin: Secondary | ICD-10-CM | POA: Diagnosis not present

## 2018-03-10 DIAGNOSIS — R0600 Dyspnea, unspecified: Secondary | ICD-10-CM

## 2018-03-10 DIAGNOSIS — Z950 Presence of cardiac pacemaker: Secondary | ICD-10-CM | POA: Diagnosis not present

## 2018-03-10 DIAGNOSIS — Z9981 Dependence on supplemental oxygen: Secondary | ICD-10-CM | POA: Diagnosis not present

## 2018-03-10 DIAGNOSIS — N186 End stage renal disease: Secondary | ICD-10-CM | POA: Diagnosis present

## 2018-03-10 DIAGNOSIS — E785 Hyperlipidemia, unspecified: Secondary | ICD-10-CM | POA: Diagnosis present

## 2018-03-10 DIAGNOSIS — Z8249 Family history of ischemic heart disease and other diseases of the circulatory system: Secondary | ICD-10-CM

## 2018-03-10 DIAGNOSIS — I5030 Unspecified diastolic (congestive) heart failure: Secondary | ICD-10-CM | POA: Diagnosis present

## 2018-03-10 DIAGNOSIS — Z7902 Long term (current) use of antithrombotics/antiplatelets: Secondary | ICD-10-CM

## 2018-03-10 DIAGNOSIS — J449 Chronic obstructive pulmonary disease, unspecified: Secondary | ICD-10-CM

## 2018-03-10 DIAGNOSIS — Z955 Presence of coronary angioplasty implant and graft: Secondary | ICD-10-CM

## 2018-03-10 DIAGNOSIS — E8770 Fluid overload, unspecified: Secondary | ICD-10-CM | POA: Diagnosis not present

## 2018-03-10 DIAGNOSIS — Z87891 Personal history of nicotine dependence: Secondary | ICD-10-CM | POA: Diagnosis not present

## 2018-03-10 DIAGNOSIS — I480 Paroxysmal atrial fibrillation: Secondary | ICD-10-CM | POA: Diagnosis present

## 2018-03-10 DIAGNOSIS — Z9842 Cataract extraction status, left eye: Secondary | ICD-10-CM

## 2018-03-10 DIAGNOSIS — Z91018 Allergy to other foods: Secondary | ICD-10-CM | POA: Diagnosis not present

## 2018-03-10 DIAGNOSIS — Y95 Nosocomial condition: Secondary | ICD-10-CM | POA: Diagnosis present

## 2018-03-10 DIAGNOSIS — I132 Hypertensive heart and chronic kidney disease with heart failure and with stage 5 chronic kidney disease, or end stage renal disease: Principal | ICD-10-CM | POA: Diagnosis present

## 2018-03-10 DIAGNOSIS — L8990 Pressure ulcer of unspecified site, unspecified stage: Secondary | ICD-10-CM

## 2018-03-10 DIAGNOSIS — I120 Hypertensive chronic kidney disease with stage 5 chronic kidney disease or end stage renal disease: Secondary | ICD-10-CM | POA: Diagnosis not present

## 2018-03-10 DIAGNOSIS — E1122 Type 2 diabetes mellitus with diabetic chronic kidney disease: Secondary | ICD-10-CM | POA: Diagnosis present

## 2018-03-10 DIAGNOSIS — R778 Other specified abnormalities of plasma proteins: Secondary | ICD-10-CM

## 2018-03-10 LAB — CBC
HCT: 31.7 % — ABNORMAL LOW (ref 36.0–46.0)
HEMOGLOBIN: 9.8 g/dL — AB (ref 12.0–15.0)
MCH: 30.4 pg (ref 26.0–34.0)
MCHC: 30.9 g/dL (ref 30.0–36.0)
MCV: 98.4 fL (ref 78.0–100.0)
PLATELETS: 233 10*3/uL (ref 150–400)
RBC: 3.22 MIL/uL — ABNORMAL LOW (ref 3.87–5.11)
RDW: 16.1 % — AB (ref 11.5–15.5)
WBC: 26.3 10*3/uL — ABNORMAL HIGH (ref 4.0–10.5)

## 2018-03-10 LAB — I-STAT ARTERIAL BLOOD GAS, ED
ACID-BASE DEFICIT: 2 mmol/L (ref 0.0–2.0)
BICARBONATE: 22.3 mmol/L (ref 20.0–28.0)
O2 SAT: 97 %
PCO2 ART: 34.2 mmHg (ref 32.0–48.0)
PH ART: 7.422 (ref 7.350–7.450)
PO2 ART: 84 mmHg (ref 83.0–108.0)
TCO2: 23 mmol/L (ref 22–32)

## 2018-03-10 LAB — GLUCOSE, CAPILLARY
Glucose-Capillary: 124 mg/dL — ABNORMAL HIGH (ref 65–99)
Glucose-Capillary: 189 mg/dL — ABNORMAL HIGH (ref 65–99)

## 2018-03-10 LAB — BASIC METABOLIC PANEL
ANION GAP: 14 (ref 5–15)
BUN: 74 mg/dL — AB (ref 6–20)
CALCIUM: 8.5 mg/dL — AB (ref 8.9–10.3)
CO2: 21 mmol/L — AB (ref 22–32)
CREATININE: 4.05 mg/dL — AB (ref 0.44–1.00)
Chloride: 102 mmol/L (ref 101–111)
GFR calc Af Amer: 11 mL/min — ABNORMAL LOW (ref >60)
GFR, EST NON AFRICAN AMERICAN: 9 mL/min — AB (ref >60)
GLUCOSE: 180 mg/dL — AB (ref 65–99)
Potassium: 4.9 mmol/L (ref 3.5–5.1)
Sodium: 137 mmol/L (ref 135–145)

## 2018-03-10 LAB — D-DIMER, QUANTITATIVE (NOT AT ARMC): D-Dimer, Quant: 1.63 ug/mL-FEU — ABNORMAL HIGH (ref 0.00–0.50)

## 2018-03-10 LAB — I-STAT TROPONIN, ED: TROPONIN I, POC: 0.11 ng/mL — AB (ref 0.00–0.08)

## 2018-03-10 LAB — PROTIME-INR
INR: 1.11
PROTHROMBIN TIME: 14.2 s (ref 11.4–15.2)

## 2018-03-10 LAB — PROCALCITONIN: Procalcitonin: 0.25 ng/mL

## 2018-03-10 LAB — MRSA PCR SCREENING: MRSA BY PCR: NEGATIVE

## 2018-03-10 LAB — I-STAT CG4 LACTIC ACID, ED: Lactic Acid, Venous: 1.76 mmol/L (ref 0.5–1.9)

## 2018-03-10 LAB — TROPONIN I: Troponin I: 1.23 ng/mL (ref <0.03)

## 2018-03-10 MED ORDER — MAGIC MOUTHWASH
5.0000 mL | Freq: Four times a day (QID) | ORAL | Status: DC | PRN
  Administered 2018-03-10 – 2018-03-13 (×8): 5 mL via ORAL
  Filled 2018-03-10 (×11): qty 5

## 2018-03-10 MED ORDER — ALBUTEROL SULFATE (2.5 MG/3ML) 0.083% IN NEBU
2.5000 mg | INHALATION_SOLUTION | RESPIRATORY_TRACT | Status: DC | PRN
  Administered 2018-03-11: 2.5 mg via RESPIRATORY_TRACT
  Filled 2018-03-10: qty 3

## 2018-03-10 MED ORDER — LIDOCAINE HCL (PF) 1 % IJ SOLN: 5.0000 mL | INTRAMUSCULAR | Status: DC | PRN

## 2018-03-10 MED ORDER — PENTAFLUOROPROP-TETRAFLUOROETH EX AERO: 1.0000 "application " | INHALATION_SPRAY | CUTANEOUS | Status: DC | PRN

## 2018-03-10 MED ORDER — PANTOPRAZOLE SODIUM 40 MG PO TBEC
40.0000 mg | DELAYED_RELEASE_TABLET | Freq: Every day | ORAL | Status: DC
  Administered 2018-03-11 – 2018-03-13 (×3): 40 mg via ORAL
  Filled 2018-03-10 (×3): qty 1

## 2018-03-10 MED ORDER — SODIUM CHLORIDE 0.9 % IV SOLN
1.0000 g | INTRAVENOUS | Status: DC
  Administered 2018-03-10 – 2018-03-11 (×2): 1 g via INTRAVENOUS
  Filled 2018-03-10 (×2): qty 1

## 2018-03-10 MED ORDER — SODIUM CHLORIDE 0.9 % IV SOLN: 100.0000 mL | INTRAVENOUS | Status: DC | PRN

## 2018-03-10 MED ORDER — IPRATROPIUM-ALBUTEROL 0.5-2.5 (3) MG/3ML IN SOLN
3.0000 mL | Freq: Four times a day (QID) | RESPIRATORY_TRACT | Status: DC
  Administered 2018-03-10 – 2018-03-13 (×10): 3 mL via RESPIRATORY_TRACT
  Filled 2018-03-10 (×10): qty 3

## 2018-03-10 MED ORDER — ATORVASTATIN CALCIUM 40 MG PO TABS
40.0000 mg | ORAL_TABLET | Freq: Every day | ORAL | Status: DC
  Administered 2018-03-10 – 2018-03-12 (×3): 40 mg via ORAL
  Filled 2018-03-10 (×3): qty 1

## 2018-03-10 MED ORDER — HEPARIN SODIUM (PORCINE) 5000 UNIT/ML IJ SOLN
5000.0000 [IU] | Freq: Three times a day (TID) | INTRAMUSCULAR | Status: DC
  Administered 2018-03-10 – 2018-03-13 (×7): 5000 [IU] via SUBCUTANEOUS
  Filled 2018-03-10 (×7): qty 1

## 2018-03-10 MED ORDER — LEVOTHYROXINE SODIUM 50 MCG PO TABS
50.0000 ug | ORAL_TABLET | Freq: Every day | ORAL | Status: DC
  Administered 2018-03-11 – 2018-03-13 (×3): 50 ug via ORAL
  Filled 2018-03-10 (×3): qty 1

## 2018-03-10 MED ORDER — MAGIC MOUTHWASH: 5.0000 mL | Freq: Three times a day (TID) | ORAL | Status: DC | PRN

## 2018-03-10 MED ORDER — POLYETHYLENE GLYCOL 3350 17 G PO PACK
17.0000 g | PACK | Freq: Every day | ORAL | Status: DC | PRN
  Administered 2018-03-11: 17 g via ORAL
  Filled 2018-03-10: qty 1

## 2018-03-10 MED ORDER — CLOPIDOGREL BISULFATE 75 MG PO TABS: 75.0000 mg | ORAL_TABLET | Freq: Every day | ORAL | Status: DC

## 2018-03-10 MED ORDER — SODIUM CHLORIDE 0.9% FLUSH
3.0000 mL | Freq: Two times a day (BID) | INTRAVENOUS | Status: DC
  Administered 2018-03-10 – 2018-03-12 (×5): 3 mL via INTRAVENOUS

## 2018-03-10 MED ORDER — HEPARIN SODIUM (PORCINE) 1000 UNIT/ML DIALYSIS: 1000.0000 [IU] | INTRAMUSCULAR | Status: DC | PRN

## 2018-03-10 MED ORDER — ALTEPLASE 2 MG IJ SOLR: 2.0000 mg | Freq: Once | INTRAMUSCULAR | Status: DC | PRN

## 2018-03-10 MED ORDER — AMIODARONE HCL 200 MG PO TABS
200.0000 mg | ORAL_TABLET | Freq: Every day | ORAL | Status: DC
  Administered 2018-03-10 – 2018-03-13 (×4): 200 mg via ORAL
  Filled 2018-03-10 (×4): qty 1

## 2018-03-10 MED ORDER — VANCOMYCIN HCL IN DEXTROSE 750-5 MG/150ML-% IV SOLN
750.0000 mg | INTRAVENOUS | Status: DC
  Filled 2018-03-10: qty 150

## 2018-03-10 MED ORDER — ACETAMINOPHEN 325 MG PO TABS
650.0000 mg | ORAL_TABLET | Freq: Four times a day (QID) | ORAL | Status: DC | PRN
  Administered 2018-03-13: 650 mg via ORAL

## 2018-03-10 MED ORDER — NYSTATIN 100000 UNIT/ML MT SUSP
5.0000 mL | Freq: Four times a day (QID) | OROMUCOSAL | Status: DC
  Administered 2018-03-10 – 2018-03-13 (×8): 500000 [IU] via ORAL
  Filled 2018-03-10 (×8): qty 5

## 2018-03-10 MED ORDER — ASPIRIN EC 81 MG PO TBEC
81.0000 mg | DELAYED_RELEASE_TABLET | Freq: Every day | ORAL | Status: DC
  Administered 2018-03-11 – 2018-03-13 (×3): 81 mg via ORAL
  Filled 2018-03-10 (×3): qty 1

## 2018-03-10 MED ORDER — ASPIRIN EC 81 MG PO TBEC: 81.0000 mg | DELAYED_RELEASE_TABLET | Freq: Every day | ORAL | Status: DC

## 2018-03-10 MED ORDER — METHYLPREDNISOLONE SODIUM SUCC 125 MG IJ SOLR
125.0000 mg | Freq: Once | INTRAMUSCULAR | Status: AC
  Administered 2018-03-10: 125 mg via INTRAVENOUS
  Filled 2018-03-10: qty 2

## 2018-03-10 MED ORDER — VANCOMYCIN HCL IN DEXTROSE 750-5 MG/150ML-% IV SOLN
INTRAVENOUS | Status: AC
  Administered 2018-03-10: 750 mg
  Filled 2018-03-10: qty 150

## 2018-03-10 MED ORDER — LIDOCAINE-PRILOCAINE 2.5-2.5 % EX CREA: 1.0000 "application " | TOPICAL_CREAM | CUTANEOUS | Status: DC | PRN

## 2018-03-10 MED ORDER — ACETAMINOPHEN 650 MG RE SUPP: 650.0000 mg | Freq: Four times a day (QID) | RECTAL | Status: DC | PRN

## 2018-03-10 MED ORDER — OXYBUTYNIN CHLORIDE ER 10 MG PO TB24: 10.0000 mg | ORAL_TABLET | Freq: Every day | ORAL | Status: DC

## 2018-03-10 MED ORDER — MOMETASONE FURO-FORMOTEROL FUM 200-5 MCG/ACT IN AERO
2.0000 | INHALATION_SPRAY | Freq: Two times a day (BID) | RESPIRATORY_TRACT | Status: DC
  Administered 2018-03-10 – 2018-03-12 (×5): 2 via RESPIRATORY_TRACT
  Filled 2018-03-10: qty 8.8

## 2018-03-10 NOTE — Progress Notes (Signed)
Pharmacy Antibiotic Note  Stacie Roman is a 84 y.o. female admitted on 03/10/2018 with pneumonia.  Pharmacy has been consulted for Vancomycin and cefepime dosing. Patient has a history of CKD on MWF HD. She was started on IV vancomycin on Monday, 5/6 and received her first dose of vancomycin during HD session. WBC elevated at 26.3. Afebrile   Plan: -Cefepime 1 gm IV Q 24 hours -Vancomycin 750 mg IV Q HD starting with today's session -Monitor CBC, cultures and clinical progress -VT at SS     Temp (24hrs), Avg:97.5 F (36.4 C), Min:97.5 F (36.4 C), Max:97.5 F (36.4 C)  Recent Labs  Lab 03/10/18 0555  WBC 26.3*  CREATININE 4.05*    CrCl cannot be calculated (Unknown ideal weight.).    Allergies  Allergen Reactions  . Hydralazine Other (See Comments) and Shortness Of Breath    Dizzy, lightheaded Knocks her out  . Mushroom Extract Complex Anaphylaxis, Shortness Of Breath and Swelling    Throat swelling  . Aspirin Other (See Comments)    Bleeding disorder  . Percocet [Oxycodone-Acetaminophen] Itching  . Morphine And Related Nausea And Vomiting    Antimicrobials this admission: Vanc 5/8 >>  Cefepime 5/8 >> =  Dose adjustments this admission: None  Microbiology results:   Thank you for allowing pharmacy to be a part of this patient's care.  Albertina Parr, PharmD., BCPS Clinical Pharmacist Clinical phone for 5/81/9 until 3:30pm: [phone] If after 3:30pm, please call main pharmacy at: [phone]

## 2018-03-10 NOTE — ED Notes (Signed)
Pt was off BIPAP for 30 minutes and requested to go back on due to sob.

## 2018-03-10 NOTE — Consult Note (Signed)
[facility] Consult Note     Date: 03/10/2018                  Patient Name:  Stacie Roman  MRN: [id number]  DOB: [birth year]  Age / Sex: 84 y.o., female         PCP: [name], MD                 Service Requesting Consult:  Internal Medicine, Dr. Juleen China                 Reason for Consult: ESRD on HD             Chief Complaint: SOB HPI: 84 y/o F with PMH of ESRD on HD 2/2 HTN-  dialysis MWF in [place], COPD/asthma, hypertension, hyperlipidemia, CAD s/p stenting in August 2018, OA, GERD, paroxysmal A. Fib.   P/w shortness of breath that started last night and progressively worsened into today.  Leukocytosis to 26, and reporting a productive cough.  No fevers, chills, chest pain, abdominal pain.  CXR in ED with bilateral airspace opacities concerning for pneumonia.  Started on Vancomycin and cefepime and bcx obtained.  Nephrology consulted for dialysis.   Past Medical History:  Diagnosis Date  . Arthritis    DDD, neck & low back   . Asthma   . Bleeding nose    tx with cauterized at time   . Cancer ([place]) 1990's   removed - from face, ? Pre CA  . CHF (congestive heart failure) ([place])   . Chronic kidney disease    stage 4- renal failure , stent in each kidney   . COPD (chronic obstructive pulmonary disease) ([place])   . Encounter for TB tine test 1955   treated /w medicine due to + tine test  . GERD (gastroesophageal reflux disease)   . Hypertension   . Hypothyroidism   . Presence of permanent cardiac pacemaker   . Stroke [facility]) 2010   had aphasia , treated at [facility]- all resolved     Past Surgical History:  Procedure Laterality Date  . ABDOMINAL HYSTERECTOMY    . APPENDECTOMY    . BACK SURGERY  2001   lumbar  . EYE SURGERY     IOL followed cataracats being removed.   Randolm Idol / REPLACE / REMOVE PACEMAKER  06/2015   [employer]   . LUMBAR FUSION  06/072017  . renal stents   02/2015   followed by CHF    Family History  Family history unknown: Yes    Social History:  reports that she quit smoking about 58 years ago. She has never used smokeless tobacco. She reports that she does not drink alcohol or use drugs.  Allergies:  Allergies  Allergen Reactions  . Hydralazine Other (See Comments) and Shortness Of Breath    Dizzy, lightheaded Knocks her out  . Mushroom Extract Complex Anaphylaxis, Shortness Of Breath and Swelling    Throat swelling  . Aspirin Other (See Comments)    Bleeding disorder  . Percocet [Oxycodone-Acetaminophen] Itching  . Morphine And Related Nausea And Vomiting    Medications Prior to Admission  Medication Sig Dispense Refill  . albuterol (PROVENTIL HFA;VENTOLIN HFA) 108 (90 Base) MCG/ACT inhaler Inhale 1-2 puffs into the lungs every 6 (six) hours as needed for wheezing or shortness of breath.    Marland Kitchen albuterol (PROVENTIL) (2.5 MG/3ML) 0.083% nebulizer solution Take 2.5 mg by nebulization every 6 (six) hours as needed for wheezing or  shortness of breath.    Marland Kitchen amiodarone (PACERONE) 200 MG tablet Take 200 mg by mouth daily.    Marland Kitchen aspirin EC 81 MG tablet Take 81 mg by mouth daily.     Marland Kitchen atorvastatin (LIPITOR) 40 MG tablet Take 40 mg by mouth daily.    . bisacodyl (DULCOLAX) 5 MG EC tablet Take 10 mg by mouth 2 (two) times daily.     . Cholecalciferol (VITAMIN D3) 5000 units CAPS Take 5,000 Units by mouth daily.     . clopidogrel (PLAVIX) 75 MG tablet Take 75 mg by mouth daily.    Marland Kitchen docusate sodium (COLACE) 100 MG capsule Take 200 mg by mouth 2 (two) times daily.    . furosemide (LASIX) 40 MG tablet Take 40 mg by mouth 2 (two) times daily.    Marland Kitchen gabapentin (NEURONTIN) 100 MG capsule Take 1 capsule (100 mg total) by mouth 2 (two) times daily. 60 capsule 1  . levothyroxine (SYNTHROID, LEVOTHROID) 50 MCG tablet Take 50 mcg by mouth daily before breakfast.     . loratadine (CLARITIN) 10 MG tablet Take 10 mg by mouth daily.     . metoprolol succinate (TOPROL-XL) 25 MG 24 hr tablet Take 25 mg by mouth daily.    .  mometasone-formoterol (DULERA) 200-5 MCG/ACT AERO Inhale 2 puffs into the lungs 2 (two) times daily. 1 Inhaler 0  . nystatin (MYCOSTATIN) 100000 UNIT/ML suspension Take 5 mLs by mouth 4 (four) times daily.    Marland Kitchen omeprazole (PRILOSEC) 20 MG capsule Take 20 mg by mouth daily.    Marland Kitchen oxybutynin (DITROPAN-XL) 10 MG 24 hr tablet Take 10 mg by mouth daily at 6 PM.     . predniSONE (DELTASONE) 20 MG tablet Take 10-20 mg by mouth See admin instructions. Take 1 tablet twice daily for 3 days then take 1/2 tablet twice daily for 3 days then take 1 tablet daily for 3 days then stop    . umeclidinium bromide (INCRUSE ELLIPTA) 62.5 MCG/INH AEPB Inhale 1 puff into the lungs daily.    . vitamin B-12 (CYANOCOBALAMIN) 1000 MCG tablet Take 1,000 mcg by mouth daily.    . OXYGEN Inhale 2 L into the lungs continuous. 2 L through the night as needed during the day      Results for orders placed or performed during the hospital encounter of 03/10/18 (from the past 48 hour(s))  Basic metabolic panel     Status: Abnormal   Collection Time: 03/10/18  5:55 AM  Result Value Ref Range   Sodium 137 135 - 145 mmol/L   Potassium 4.9 3.5 - 5.1 mmol/L   Chloride 102 101 - 111 mmol/L   CO2 21 (L) 22 - 32 mmol/L   Glucose, Bld 180 (H) 65 - 99 mg/dL   BUN 74 (H) 6 - 20 mg/dL   Creatinine, Ser 4.05 (H) 0.44 - 1.00 mg/dL   Calcium 8.5 (L) 8.9 - 10.3 mg/dL   GFR calc non Af Amer 9 (L) >60 mL/min   GFR calc Af Amer 11 (L) >60 mL/min    Comment: (NOTE) The eGFR has been calculated using the CKD EPI equation. This calculation has not been validated in all clinical situations. eGFR's persistently <60 mL/min signify possible Chronic Kidney Disease.    Anion gap 14 5 - 15    Comment: Performed at [facility] [address] [place], [place] [address]  CBC     Status: Abnormal   Collection Time: 03/10/18  5:55  AM  Result Value Ref Range   WBC 26.3 (H) 4.0 - 10.5 K/uL    Comment: REPEATED TO VERIFY   RBC 3.22 (L) 3.87 -  5.11 MIL/uL   Hemoglobin 9.8 (L) 12.0 - 15.0 g/dL    Comment: REPEATED TO VERIFY   HCT 31.7 (L) 36.0 - 46.0 %   MCV 98.4 78.0 - 100.0 fL   MCH 30.4 26.0 - 34.0 pg   MCHC 30.9 30.0 - 36.0 g/dL   RDW 16.1 (H) 11.5 - 15.5 %   Platelets 233 150 - 400 K/uL    Comment: Performed at [facility] [address], [place], [place] [address]  I-stat troponin, ED     Status: Abnormal   Collection Time: 03/10/18  6:01 AM  Result Value Ref Range   Troponin i, poc 0.11 (HH) 0.00 - 0.08 ng/mL   Comment NOTIFIED PHYSICIAN    Comment 3            Comment: Due to the release kinetics of cTnI, a negative result within the first hours of the onset of symptoms does not rule out myocardial infarction with certainty. If myocardial infarction is still suspected, repeat the test at appropriate intervals.   I-Stat arterial blood gas, ED     Status: None   Collection Time: 03/10/18  8:25 AM  Result Value Ref Range   pH, Arterial 7.422 7.350 - 7.450   pCO2 arterial 34.2 32.0 - 48.0 mmHg   pO2, Arterial 84.0 83.0 - 108.0 mmHg   Bicarbonate 22.3 20.0 - 28.0 mmol/L   TCO2 23 22 - 32 mmol/L   O2 Saturation 97.0 %   Acid-base deficit 2.0 0.0 - 2.0 mmol/L   Patient temperature HIDE    Collection site RADIAL, ALLEN'S TEST ACCEPTABLE    Drawn by RT    Sample type ARTERIAL   I-Stat CG4 Lactic Acid, ED     Status: None   Collection Time: 03/10/18  8:44 AM  Result Value Ref Range   Lactic Acid, Venous 1.76 0.5 - 1.9 mmol/L  Troponin I     Status: Abnormal   Collection Time: 03/10/18 11:15 AM  Result Value Ref Range   Troponin I 0.42 (HH) <0.03 ng/mL    Comment: CRITICAL RESULT CALLED TO, READ BACK BY AND VERIFIED WITH: Made several calls unable to reach patient nurse until 1235 03/10/18 V Thompson,rn Performed at [facility], [place] [address] [place], [place] [address]   D-dimer, quantitative (not at [facility])     Status: Abnormal   Collection Time: 03/10/18 11:15 AM  Result Value Ref Range    D-Dimer, Quant 1.63 (H) 0.00 - 0.50 ug/mL-FEU    Comment: (NOTE) At the manufacturer cut-off of 0.50 ug/mL FEU, this assay has been documented to exclude PE with a sensitivity and negative predictive value of 97 to 99%.  At this time, this assay has not been approved by the FDA to exclude DVT/VTE. Results should be correlated with clinical presentation. Performed at [facility], [place] [address] [place], [place] [address]   Procalcitonin     Status: None   Collection Time: 03/10/18 11:15 AM  Result Value Ref Range   Procalcitonin 0.25 ng/mL    Comment:        Interpretation: PCT (Procalcitonin) <= 0.5 ng/mL: Systemic infection (sepsis) is not likely. Local bacterial infection is possible. (NOTE)       Sepsis PCT Algorithm  Lower Respiratory Tract                                      Infection PCT Algorithm    ----------------------------     ----------------------------         PCT < 0.25 ng/mL                PCT < 0.10 ng/mL         Strongly encourage             Strongly discourage   discontinuation of antibiotics    initiation of antibiotics    ----------------------------     -----------------------------       PCT 0.25 - 0.50 ng/mL            PCT 0.10 - 0.25 ng/mL               OR       >80% decrease in PCT            Discourage initiation of                                            antibiotics      Encourage discontinuation           of antibiotics    ----------------------------     -----------------------------         PCT >= 0.50 ng/mL              PCT 0.26 - 0.50 ng/mL               AND        <80% decrease in PCT             Encourage initiation of                                             antibiotics       Encourage continuation           of antibiotics    ----------------------------     -----------------------------        PCT >= 0.50 ng/mL                  PCT > 0.50 ng/mL               AND         increase in PCT                  Strongly  encourage                                      initiation of antibiotics    Strongly encourage escalation           of antibiotics                                     -----------------------------  PCT <= 0.25 ng/mL                                                 OR                                        > 80% decrease in PCT                                     Discontinue / Do not initiate                                             antibiotics Performed at [facility], [place] [address] [place], [place] [address]    Dg Chest Portable 1 View  Result Date: 03/10/2018 CLINICAL DATA:  Shortness of breath.  History of asthma-COPD, CHF. EXAM: PORTABLE CHEST 1 VIEW COMPARISON:  Chest x-ray of Mar 09, 2018 FINDINGS: The lungs are hyperinflated. The interstitial markings remain increased but have improved slightly. Confluent airspace opacities persist bilaterally. There is a small left pleural effusion. The heart is top-normal in size. The pulmonary vascularity is engorged and indistinct. There is calcification in the wall of the thoracic aorta. The ICD is in stable position. IMPRESSION: Slight interval improvement in the appearance of the pulmonary interstitium. Bilateral airspace opacities compatible with pneumonia or asymmetric pulmonary edema. Small left pleural effusion, stable. Stable cardiomegaly. Electronically Signed   By: David  Martinique M.D.   On: 03/10/2018 06:51   Review of Systems  Constitutional: Negative for chills and fever.  HENT: Negative for congestion.   Respiratory: Positive for cough, sputum production and shortness of breath.   Cardiovascular: Negative for chest pain and leg swelling.  Gastrointestinal: Negative for abdominal pain, nausea and vomiting.  Genitourinary: Negative for dysuria.  Skin: Negative for rash.   Blood pressure (!) 119/51, pulse 70, temperature 98.1 F (36.7 C), temperature source Oral, resp. rate (!) 21, SpO2  100 %.   Physical Exam  General:  84 year old elderly female, on BiPAP  Neuro:  Alert, oriented x4, no focal deficits  HEENT:  NCAT, EOMI, oropharynx clear, MMM  Cardiovascular:  RRR no MRG Lungs:  Decreased breath sounds bilateral bases Abdomen: , NTND, positive bowel sounds Musculoskeletal:  Normal range of motion x4 Skin: AV graft right antecubital fossa positive bruit and thrill   Assessment/Plan 1. ESRD on HD 2/2 HTN- receives dialysis MWF in [place].  SCr 4.05 on admission.  Plan for HD today.   2. HTN- well controlled  4. Hgb: 9.8, stable, continue to monitor  5. COPD/asthma 6. HLD 7. CAD  8. OA 9. GERD 10. paroxysmal A. Fib.    Lovenia Kim, MD [place], PGY-2

## 2018-03-10 NOTE — Progress Notes (Signed)
Pt taken off bipap and placed on 3L [place] at this time. Pt still continues to have slight increased WOB, VS within normal limits. Pt denies SOB and states she feels "much better". RT will continue to closely monitor pt for possible bipap need again

## 2018-03-10 NOTE — Consult Note (Signed)
PULMONARY / CRITICAL CARE MEDICINE   Name: RABAB CURRINGTON MRN: [id number] DOB: [birth year]    ADMISSION DATE:  03/10/2018 CONSULTATION DATE: 03/10/2018  REFERRING MD: Teaching service  CHIEF COMPLAINT: Hemoptysis, dyspnea  HISTORY OF PRESENT ILLNESS:   84 year old female former smoker who quit at age 62 who has a plethora of health issues that include but are not limited to coronary artery disease, diastolic heart dysfunction grade 2 EF 55% and grade 2 diastolic dysfunction, permanent pacemaker, on Plavix for paroxysmal atrial fibrillation and coronary artery disease, and 5 mm right middle lobe nodule has been followed at [facility].  She is on nocturnal oxygen for the last year.  She is had pulmonary function test done at [facility].  She presents to [facility] 03/10/2018 with 2 weeks of coughing 1 week of pink sputum that is increased to frank bloody.  She notes she has increased shortness of breath over the last day but had hemodialysis on Monday, 03/08/2018.  Currently she is on BiPAP 40% FiO2 and is comfortable.  She will need to receive hemodialysis today and then further evaluate her breathing status.  She is on Plavix for her coronary artery disease and her paroxysmal atrial fibrillation and history of permanent pacemaker implantation.  This will be stopped until her hemoptysis resolves.  He should be admitted to the unit for monitoring and use of BiPAP until she undergoes dialysis pulmonary critical care will follow.  PAST MEDICAL HISTORY :  She  has a past medical history of Arthritis, Asthma, Bleeding nose, Cancer ([place]) (1990's), CHF (congestive heart failure) ([place]), Chronic kidney disease, COPD (chronic obstructive pulmonary disease) ([place]), Encounter for TB tine test (1955), GERD (gastroesophageal reflux disease), Hypertension, Hypothyroidism, Presence of permanent cardiac pacemaker, and Stroke ([place]) (2010).  PAST SURGICAL HISTORY: She  has a past surgical history that  includes renal stents  (02/2015); Back surgery (2001); Eye surgery; Appendectomy; Abdominal hysterectomy; Insert / replace / remove pacemaker (06/2015); and Lumbar fusion (06/072017).  Allergies  Allergen Reactions  . Hydralazine Other (See Comments) and Shortness Of Breath    Dizzy, lightheaded Knocks her out  . Mushroom Extract Complex Anaphylaxis, Shortness Of Breath and Swelling    Throat swelling  . Aspirin Other (See Comments)    Bleeding disorder  . Percocet [Oxycodone-Acetaminophen] Itching  . Morphine And Related Nausea And Vomiting    No current facility-administered medications on file prior to encounter.    Current Outpatient Medications on File Prior to Encounter  Medication Sig  . albuterol (PROVENTIL HFA;VENTOLIN HFA) 108 (90 Base) MCG/ACT inhaler Inhale 1-2 puffs into the lungs every 6 (six) hours as needed for wheezing or shortness of breath.  Marland Kitchen albuterol (PROVENTIL) (2.5 MG/3ML) 0.083% nebulizer solution Take 2.5 mg by nebulization every 6 (six) hours as needed for wheezing or shortness of breath.  Marland Kitchen amiodarone (PACERONE) 200 MG tablet Take 200 mg by mouth daily.  Marland Kitchen aspirin EC 81 MG tablet Take 81 mg by mouth daily.   Marland Kitchen atorvastatin (LIPITOR) 40 MG tablet Take 40 mg by mouth daily.  . bisacodyl (DULCOLAX) 5 MG EC tablet Take 10 mg by mouth 2 (two) times daily.   . Cholecalciferol (VITAMIN D3) 5000 units CAPS Take 5,000 Units by mouth daily.   . clopidogrel (PLAVIX) 75 MG tablet Take 75 mg by mouth daily.  Marland Kitchen docusate sodium (COLACE) 100 MG capsule Take 200 mg by mouth 2 (two) times daily.  . furosemide (LASIX) 40 MG tablet Take 40 mg by  mouth 2 (two) times daily.  Marland Kitchen gabapentin (NEURONTIN) 100 MG capsule Take 1 capsule (100 mg total) by mouth 2 (two) times daily.  Marland Kitchen levothyroxine (SYNTHROID, LEVOTHROID) 50 MCG tablet Take 50 mcg by mouth daily before breakfast.   . loratadine (CLARITIN) 10 MG tablet Take 10 mg by mouth daily.   . metoprolol succinate (TOPROL-XL) 25 MG 24  hr tablet Take 25 mg by mouth daily.  . mometasone-formoterol (DULERA) 200-5 MCG/ACT AERO Inhale 2 puffs into the lungs 2 (two) times daily.  Marland Kitchen nystatin (MYCOSTATIN) 100000 UNIT/ML suspension Take 5 mLs by mouth 4 (four) times daily.  Marland Kitchen omeprazole (PRILOSEC) 20 MG capsule Take 20 mg by mouth daily.  Marland Kitchen oxybutynin (DITROPAN-XL) 10 MG 24 hr tablet Take 10 mg by mouth daily at 6 PM.   . predniSONE (DELTASONE) 20 MG tablet Take 10-20 mg by mouth See admin instructions. Take 1 tablet twice daily for 3 days then take 1/2 tablet twice daily for 3 days then take 1 tablet daily for 3 days then stop  . umeclidinium bromide (INCRUSE ELLIPTA) 62.5 MCG/INH AEPB Inhale 1 puff into the lungs daily.  . vitamin B-12 (CYANOCOBALAMIN) 1000 MCG tablet Take 1,000 mcg by mouth daily.  . OXYGEN Inhale 2 L into the lungs continuous. 2 L through the night as needed during the day    FAMILY HISTORY:  Daughter has a history of sleep apnea Coronary artery disease and mother Hypertension in her son   SOCIAL HISTORY: She  reports that she quit smoking about 58 years ago. She has never used smokeless tobacco. She reports that she does not drink alcohol or use drugs.  REVIEW OF SYSTEMS:   10 point review of system taken, please see HPI for positives and negatives.   SUBJECTIVE:  Elderly female in moderate respiratory distress as well compensated with BiPAP  VITAL SIGNS: BP 140/60 (BP Location: Right Arm)   Pulse 70   Temp (!) 97.5 F (36.4 C)   Resp 16   SpO2 99%   HEMODYNAMICS:    VENTILATOR SETTINGS: Vent Mode: BIPAP FiO2 (%):  [40 %-60 %] 40 % PEEP:  [6 cmH20] 6 cmH20 Pressure Support:  [8 cmH20-14 cmH20] 8 cmH20  INTAKE / OUTPUT: No intake/output data recorded.  PHYSICAL EXAMINATION: General: Elderly female currently on BiPAP but able to carry on conversations comfortably Neuro: Grossly intact, follows commands moves all extremities x4.  Appears to be orientated no JVD or lymphadenopathy is  appreciated HEENT:  , Oropharynx is unremarkable no overt blood in her mouth. Cardiovascular: Heart sounds regular regular rate and rhythm Lungs: Decreased breath sounds in the bases Abdomen: Soft nontender Musculoskeletal: Intact Skin: Right AV graft right antecubital fossa positive bruit and thrill  LABS:  BMET Recent Labs  Lab 03/10/18 0555  NA 137  K 4.9  CL 102  CO2 21*  BUN 74*  CREATININE 4.05*  GLUCOSE 180*    Electrolytes Recent Labs  Lab 03/10/18 0555  CALCIUM 8.5*    CBC Recent Labs  Lab 03/10/18 0555  WBC 26.3*  HGB 9.8*  HCT 31.7*  PLT 233    Coag's No results for input(s): APTT, INR in the last 168 hours.  Sepsis Markers Recent Labs  Lab 03/10/18 0844  LATICACIDVEN 1.76    ABG Recent Labs  Lab 03/10/18 0825  PHART 7.422  PCO2ART 34.2  PO2ART 84.0    Liver Enzymes No results for input(s): AST, ALT, ALKPHOS, BILITOT, ALBUMIN in the last 168 hours.  Cardiac  Enzymes No results for input(s): TROPONINI, PROBNP in the last 168 hours.  Glucose No results for input(s): GLUCAP in the last 168 hours.  Imaging Dg Chest Portable 1 View  Result Date: 03/10/2018 CLINICAL DATA:  Shortness of breath.  History of asthma-COPD, CHF. EXAM: PORTABLE CHEST 1 VIEW COMPARISON:  Chest x-ray of Mar 09, 2018 FINDINGS: The lungs are hyperinflated. The interstitial markings remain increased but have improved slightly. Confluent airspace opacities persist bilaterally. There is a small left pleural effusion. The heart is top-normal in size. The pulmonary vascularity is engorged and indistinct. There is calcification in the wall of the thoracic aorta. The ICD is in stable position. IMPRESSION: Slight interval improvement in the appearance of the pulmonary interstitium. Bilateral airspace opacities compatible with pneumonia or asymmetric pulmonary edema. Small left pleural effusion, stable. Stable cardiomegaly. Electronically Signed   By: David  Martinique M.D.   On:  03/10/2018 06:51     STUDIES:    CULTURES:   ANTIBIOTICS: 03/10/2018 vancomycin 03/10/2018 Zosyn  SIGNIFICANT EVENTS:   LINES/TUBES:   DISCUSSION: 84 year old female former smoker who quit at age 71 who has a plethora of health issues that include but are not limited to coronary artery disease, diastolic heart dysfunction grade 2 EF 55% and grade 2 diastolic dysfunction, permanent pacemaker, on Plavix for paroxysmal atrial fibrillation and coronary artery disease, and 5 mm right middle lobe nodule has been followed at [facility].  She is on nocturnal oxygen for the last year.  She is had pulmonary function test done at [facility].  She presents to [facility] 03/10/2018 with 2 weeks of coughing 1 week of pink sputum that is increased to frank bloody.  She notes she has increased shortness of breath over the last day but had hemodialysis on Monday, 03/08/2018.  Currently she is on BiPAP 40% FiO2 and is comfortable.  She will need to receive hemodialysis today and then further evaluate her breathing status.  She is on Plavix for her coronary artery disease and her paroxysmal atrial fibrillation and history of permanent pacemaker implantation.  This will be stopped until her hemoptysis resolves.  He should be admitted to the unit for monitoring and use of BiPAP until she undergoes dialysis pulmonary critical care will follow.  ASSESSMENT / PLAN:  PULMONARY A: Emphysema COPD former smoker 1 pack a day until she quit at age 39 O2 dependent nocturnally Chronically uses bronchodilators New onset of hemoptysis while on Plavix having 2-week episode of cough Acute on chronic shortness of breath P:   Stop Plavix Suspect when she is dialyzed or shortness of breath will improve. He may be true she has hemoptysis and she is having shortness of breath but they may not be related.  She is chronically on Plavix had a 2-week episode of cough removed a plug and this may have led to  further bleeding.  She was noted to have 6 pounds removed her last hemodialysis she is probably volume overloaded this is contributing to her shortness of breath with her diastolic heart failure grade 2 Reevaluate when she is had hemodialysis O2 as needed PRN noninvasive mechanical ventilatory support Note she would agree to short-term intubation but not long-term. If she continues to have hemoptysis might need FOB.  CARDIOVASCULAR A:  History of coronary artery disease History of paroxysmal atrial fibrillation History of permanent pacemaker replaced on 734 Diastolic congestive heart failure with a grade 2 diastolic dysfunction by 2D echo P:  She will need cardiac and  hemodynamic monitoring Hold Plavix for now with hemoptysis  RENAL Lab Results  Component Value Date   CREATININE 4.05 (H) 03/10/2018   CREATININE 3.02 (H) 04/20/2016   CREATININE 2.84 (H) 04/19/2016   Recent Labs  Lab 03/10/18 0555  K 4.9    A:  End-stage renal disease Hemodialysis Monday Wednesdays and Fridays. Suspected volume overload   P:   Plan for hemodialysis today Wednesday, 03/10/2018 Suspect she may be volume overloaded   GASTROINTESTINAL A:   GI protection PPI P:   PPI  HEMATOLOGY Recent Labs    03/10/18 0555  HGB 9.8*   Lab Results  Component Value Date   INR 1.21 04/19/2016     A:   Chronic treatment with Plavix for coronary artery disease New hemoptysis P:  Hold Plavix Check PTT and PT  INFECTIOUS A:    03/10/2018 placed on vancomycin and Zosyn per teaching service.  P:   Check procalcitonin for completeness  monitor white count and temperature curve chest x-ray has bilateral airspace disease probably more consistent with edema than infection.  ENDOCRINE A:   Diabetes mellitus Hypothyroidism P:   Sliding scale insulin Monitor thyroid function  NEUROLOGIC A:   Awake alert no focal neurological deficits P:   RASS goal: 1 Fully sedated sedating  medications   FAMILY  - Updates: Son at bedside 03/10/2018 and updated  - Inter-disciplinary family meet or Palliative Care meeting due by:  day 7    App cct 60 min  Richardson Landry Minor ACNP Maryanna Shape PCCM Pager [phone] till 1 pm If no answer page [phone] 03/10/2018, 11:17 AM

## 2018-03-10 NOTE — ED Provider Notes (Signed)
[place] EMERGENCY DEPARTMENT Provider Note   CSN: [id number] Arrival date & time: 03/10/18  0549     History   Chief Complaint Chief Complaint  Patient presents with  . Shortness of Breath    HPI Stacie Roman is a 84 y.o. female.  HPI Patient is an 84 year old female who presents to the emergency department after shortness of breath which developed this morning.  EMS reports a small amount of blood came up as well.  Initial sats were 82% on room air.  She was started on CPAP.  She took to room breathing treatments without improvement in her symptoms.  She does have end-stage renal disease and dialyzes on Monday Wednesday Friday.  She is due for dialysis this morning.  No use of anticoagulants.  She does have a history of asthma as well as COPD.  Started on vancomycin Monday through dialysis for suspected PNA   Past Medical History:  Diagnosis Date  . Arthritis    DDD, neck & low back   . Asthma   . Bleeding nose    tx with cauterized at time   . Cancer ([place]) 1990's   removed - from face, ? Pre CA  . CHF (congestive heart failure) ([place])   . Chronic kidney disease    stage 4- renal failure , stent in each kidney   . COPD (chronic obstructive pulmonary disease) ([place])   . Encounter for TB tine test 1955   treated /w medicine due to + tine test  . GERD (gastroesophageal reflux disease)   . Hypertension   . Hypothyroidism   . Presence of permanent cardiac pacemaker   . Stroke [facility]) 2010   had aphasia , treated at [facility]- all resolved     Patient Active Problem List   Diagnosis Date Noted  . Spondylosis of lumbar joint 04/16/2016  . Diastolic CHF ([place]) 82/50/5397  . Normocytic anemia 04/14/2016  . AKI (acute kidney injury) ([place]) 04/14/2016  . CKD (chronic kidney disease) stage 4, GFR 15-29 ml/min ([facility]) 04/14/2016  . Weakness   . Spondylolisthesis at L3-L4 level 04/09/2016    Past Surgical History:  Procedure Laterality Date  . ABDOMINAL  HYSTERECTOMY    . APPENDECTOMY    . BACK SURGERY  2001   lumbar  . EYE SURGERY     IOL followed cataracats being removed.   Randolm Idol / REPLACE / REMOVE PACEMAKER  06/2015   [employer]   . LUMBAR FUSION  06/072017  . renal stents   02/2015   followed by CHF     OB History   None      Home Medications    Prior to Admission medications   Medication Sig Start Date End Date Taking? Authorizing Provider  acetaminophen (TYLENOL) 500 MG tablet Take 1,000 mg by mouth every morning.    [provider]  acyclovir (ZOVIRAX) 400 MG tablet Take 800 mg by mouth every 8 (eight) hours as needed (for canker sores).    [provider]  aspirin EC 81 MG tablet Take 81 mg by mouth every morning.    [provider]  bisacodyl (DULCOLAX) 5 MG EC tablet Take 5-10 mg by mouth 2 (two) times daily. 5 mg every morning and 10 mg every evening or 15 mg in the evening if needed for moderate constipation    [provider]  cephALEXin (KEFLEX) 500 MG capsule Take 1 capsule (500 mg total) by mouth 4 (four) times daily. 04/20/16  Newman Pies, MD  Cholecalciferol (VITAMIN D3) 5000 units CAPS Take 5,000 Units by mouth every morning.    [provider]  cloNIDine (CATAPRES) 0.1 MG tablet Take 0.1 mg by mouth 2 (two) times daily. 01/02/16   [provider]  docusate sodium (COLACE) 100 MG capsule Take 200 mg by mouth 2 (two) times daily.    [provider]  DULERA 200-5 MCG/ACT AERO 2 puffs 2 (two) times daily. 01/03/16   [provider]  gabapentin (NEURONTIN) 100 MG capsule Take 1 capsule (100 mg total) by mouth 2 (two) times daily. 04/20/16   Newman Pies, MD  isosorbide mononitrate (IMDUR) 30 MG 24 hr tablet Take 30 mg by mouth daily at 6 PM. Take with 60 mg tablet to = 90 mg daily 03/07/16   [provider]  isosorbide mononitrate (IMDUR) 60 MG 24 hr tablet Take 60 mg by mouth daily at 6 PM. Take with 30 mg tablet to = 90 mg daily  03/07/16   [provider]  labetalol (NORMODYNE) 200 MG tablet Take 400 mg by mouth 3 (three) times daily. 01/02/16   [provider]  levothyroxine (SYNTHROID, LEVOTHROID) 50 MCG tablet Take 50 mcg by mouth daily before breakfast.  01/03/16   [provider]  loratadine (CLARITIN) 10 MG tablet Take 10 mg by mouth every morning.    [provider]  Misc Natural Products (OSTEO BI-FLEX ADV JOINT SHIELD) TABS Take 2 tablets by mouth every morning.    [provider]  mometasone-formoterol (DULERA) 200-5 MCG/ACT AERO Inhale 2 puffs into the lungs 2 (two) times daily. 04/20/16   Kary Kos, MD  NIFEdipine (PROCARDIA XL/ADALAT-CC) 60 MG 24 hr tablet Take 60 mg by mouth 2 (two) times daily. 03/07/16   [provider]  Omega-3 Fatty Acids (SALMON OIL-1000 PO) Take 1,000 mg by mouth.    [provider]  oxybutynin (DITROPAN-XL) 10 MG 24 hr tablet Take 10 mg by mouth daily at 6 PM.  03/07/16   [provider]  OXYGEN Inhale 2 L into the lungs continuous. 2 L through the night as needed during the day    [provider]  torsemide (DEMADEX) 20 MG tablet Take 40 mg by mouth every morning.  03/07/16   [provider]  VENTOLIN HFA 108 (90 Base) MCG/ACT inhaler Inhale 2 puffs into the lungs every 6 (six) hours as needed for wheezing or shortness of breath.  01/08/16   [provider]    Family History Family History  Family history unknown: Yes    Social History Social History   Tobacco Use  . Smoking status: Former Smoker    Last attempt to quit: 04/03/1959    Years since quitting: 58.9  . Smokeless tobacco: Never Used  Substance Use Topics  . Alcohol use: No  . Drug use: No     Allergies   Hydralazine; Mushroom extract complex; Aspirin; Percocet [oxycodone-acetaminophen]; and Morphine and related   Review of Systems Review of Systems  All other systems reviewed and are negative.    Physical  Exam Updated Vital Signs BP (!) 166/63   Pulse 74   Temp (!) 97.5 F (36.4 C)   Resp (!) 36   SpO2 100%   Physical Exam  Constitutional: She is oriented to person, place, and time. She appears well-developed and well-nourished. No distress.  HENT:  Head: Normocephalic and atraumatic.  Eyes: EOM are normal.  Neck: Normal range of motion.  Cardiovascular: Normal  rate, regular rhythm and normal heart sounds.  Pulmonary/Chest: Effort normal and breath sounds normal.  Abdominal: Soft. She exhibits no distension. There is no tenderness.  Musculoskeletal: Normal range of motion.       Right lower leg: She exhibits no tenderness and no edema.       Left lower leg: She exhibits no tenderness and no edema.  Neurological: She is alert and oriented to person, place, and time.  Skin: Skin is warm and dry.  Psychiatric: She has a normal mood and affect. Judgment normal.  Nursing note and vitals reviewed.    ED Treatments / Results  Labs (all labs ordered are listed, but only abnormal results are displayed) Labs Reviewed  BASIC METABOLIC PANEL - Abnormal; Notable for the following components:      Result Value   CO2 21 (*)    Glucose, Bld 180 (*)    BUN 74 (*)    Creatinine, Ser 4.05 (*)    Calcium 8.5 (*)    GFR calc non Af Amer 9 (*)    GFR calc Af Amer 11 (*)    All other components within normal limits  CBC - Abnormal; Notable for the following components:   WBC 26.3 (*)    RBC 3.22 (*)    Hemoglobin 9.8 (*)    HCT 31.7 (*)    RDW 16.1 (*)    All other components within normal limits  I-STAT TROPONIN, ED - Abnormal; Notable for the following components:   Troponin i, poc 0.11 (*)    All other components within normal limits    EKG EKG Interpretation  Date/Time:  Wednesday Mar 10 2018 05:54:35 EDT Ventricular Rate:  75 PR Interval:    QRS Duration: 117 QT Interval:  560 QTC Calculation: 613 R Axis:   88 Text Interpretation:  Normal sinus rhythm Probable LVH with  secondary repol abnrm Abnormal T, probable ischemia, lateral leads Prolonged QT interval nonspecific st and t wave changes Confirmed by Jola Schmidt [phone]) on 03/10/2018 6:47:18 AM   Radiology Dg Chest Portable 1 View  Result Date: 03/10/2018 CLINICAL DATA:  Shortness of breath.  History of asthma-COPD, CHF. EXAM: PORTABLE CHEST 1 VIEW COMPARISON:  Chest x-ray of Mar 09, 2018 FINDINGS: The lungs are hyperinflated. The interstitial markings remain increased but have improved slightly. Confluent airspace opacities persist bilaterally. There is a small left pleural effusion. The heart is top-normal in size. The pulmonary vascularity is engorged and indistinct. There is calcification in the wall of the thoracic aorta. The ICD is in stable position. IMPRESSION: Slight interval improvement in the appearance of the pulmonary interstitium. Bilateral airspace opacities compatible with pneumonia or asymmetric pulmonary edema. Small left pleural effusion, stable. Stable cardiomegaly. Electronically Signed   By: David  Martinique M.D.   On: 03/10/2018 06:51    Procedures .Critical Care Performed by: Jola Schmidt, MD Authorized by: Jola Schmidt, MD    CRITICAL CARE Performed by: Jola Schmidt Total critical care time: 33 minutes Critical care time was exclusive of separately billable procedures and treating other patients. Critical care was necessary to treat or prevent imminent or life-threatening deterioration. Critical care was time spent personally by me on the following activities: development of treatment plan with patient and/or surrogate as well as nursing, discussions with consultants, evaluation of patient's response to treatment, examination of patient, obtaining history from patient or surrogate, ordering and performing treatments and interventions, ordering and review of laboratory studies, ordering and review of radiographic studies, pulse oximetry  and re-evaluation of patient's  condition.   Medications Ordered in ED Medications  methylPREDNISolone sodium succinate (SOLU-MEDROL) 125 mg/2 mL injection 125 mg (has no administration in time range)     Initial Impression / Assessment and Plan / ED Course  I have reviewed the triage vital signs and the nursing notes.  Pertinent labs & imaging results that were available during my care of the patient were reviewed by me and considered in my medical decision making (see chart for details).     Will iniated tx for asthma/COPD. Continue bipap in the ER. Labs and cxr pending  cxr concerning for PNA. May be a component of volume overload. abx now. Blood cultures to be obtained. Improving on bipap. Internal medicine admission  Final Clinical Impressions(s) / ED Diagnoses   Final diagnoses:  None    ED Discharge Orders    None       Jola Schmidt, MD 03/10/18 [phone]

## 2018-03-10 NOTE — ED Triage Notes (Addendum)
Pt arrived by [facility] for sob and coughing up blood. Initial sats 82% on room air, CPAP placed enroute Per EMS, pt woke up at 3am with sob, took two of her own breathing treatments without relief.Pt is due for dialysis this morning and was recently admitted for fluid overload.

## 2018-03-10 NOTE — Progress Notes (Signed)
Pt c/o bride of nose hurting from bipap mask. Small piece of guaze folded and taped over bride og nose under bipap mask. Pt reports it feeling better from that. RT will continue to monitor

## 2018-03-10 NOTE — Progress Notes (Signed)
RT came to do BIPAP check. RT was able to wean pt to home regimen 3-4L [place]. Pt tol well. Mouth care done. Vitals stable. Pt in no distress

## 2018-03-10 NOTE — Progress Notes (Signed)
Patient on 3 Lpm [place] (wears 2lpm at home) No distress noted. BIPAP not at bedside.

## 2018-03-10 NOTE — Progress Notes (Addendum)
Transported pt from ED Trauma C to HD on bipap. Pt switched from Servo-I Vent/bipap to V60 bipap for HD. Pt stable throughout with no complications. VS within normal limits.

## 2018-03-10 NOTE — H&P (Addendum)
Date: 03/10/2018               Patient Name:  Stacie Roman MRN: [id number]  DOB: [birth year] Age / Sex: 84 y.o., female   PCP: [name], MD         Medical Service: Internal Medicine Teaching Service         Attending Physician: Dr. Bartholomew Crews, MD    First Contact: Dr. Berneice Gandy Pager: [phone]  Second Contact: Dr. Reesa Chew Pager: [phone]       After Hours (After 5p/  First Contact Pager: [phone]  weekends / holidays): Second Contact Pager: [phone]   Chief Complaint: Shortness of breath  History of Present Illness: This is an 84 y.o. woman with PMHx of ESRD on HD 2/2 HTN (MWF in [place]), COPD/Asthma, HTN, HLD, CAD s/p stenting in Aug 2018, OA, GERD, bradycardia s/p PPM, paroxysmal atrial fibrillation, and recent hospitalization in [facility] for COPD exacerbation requiring CPAP 1 week ago who is presenting to the ED with shortness of breath that started last night and progressively worsened throughout the morning after coughing all night with episodes of hemoptysis.  She was started on Vancomycin by her nephrologist at dialysis on Monday due to wheezing, cough, and SOB for possible pneumonia.  The SOB improved with HD and she started to feel better at that time.  Of note, her WBC was 23 on that day.  She was discharged from [facility] on a course of oral steroids.  She reports since hospital DC that she has been coughing frequently and bringing up clear mucus as well as feeling "swimmy headed".  Last night, she had episodes of hemoptysis with her coughing spells which is something she has never experienced before.  She denies any fevers, chills, chest pain, abdominal pain, syncope dysuria (continues to produce urine 3.-4 times per day).  She is chronically on oxygen 2 liters at bedtime but since leaving the hospital has been on 2 liters at all times.  When EMS arrived to her house today, she was saturating at 82% on room air.  Upon ED arrival she was placed on Bipap 8/6 with  FiO2 40%.  She improved her SpO2 to the 90's with this.  She was given Vancomycin and Cefepime with blood cultures obtained.  CXR obtained with bilateral airspace opacities c/w pneumonia or asymmetric edema.  She reports that her breathing is fair and overall feeling improved since arrival.  Labs obtained through the ED showed:  Metabolic panel without acute electrolyte abnormalities and otherwise consistent with ESRD.  CBC notable for a WBC of 26, hemoglobin 9.8, and platelets of 233.  I-stat troponin 0.11.  ABG = pH 7.42, pCO2 34.2, pO2 84, bicarbonate 21 on BMET.  Lactic acid 1.76  Meds:  Current Meds  Medication Sig  . albuterol (PROVENTIL HFA;VENTOLIN HFA) 108 (90 Base) MCG/ACT inhaler Inhale 1-2 puffs into the lungs every 6 (six) hours as needed for wheezing or shortness of breath.  Marland Kitchen albuterol (PROVENTIL) (2.5 MG/3ML) 0.083% nebulizer solution Take 2.5 mg by nebulization every 6 (six) hours as needed for wheezing or shortness of breath.  Marland Kitchen amiodarone (PACERONE) 200 MG tablet Take 200 mg by mouth daily.  Marland Kitchen aspirin EC 81 MG tablet Take 81 mg by mouth daily.   Marland Kitchen atorvastatin (LIPITOR) 40 MG tablet Take 40 mg by mouth daily.  . bisacodyl (DULCOLAX) 5 MG EC tablet Take 10 mg by mouth 2 (two) times daily.   . Cholecalciferol (  VITAMIN D3) 5000 units CAPS Take 5,000 Units by mouth daily.   . clopidogrel (PLAVIX) 75 MG tablet Take 75 mg by mouth daily.  Marland Kitchen docusate sodium (COLACE) 100 MG capsule Take 200 mg by mouth 2 (two) times daily.  . furosemide (LASIX) 40 MG tablet Take 40 mg by mouth 2 (two) times daily.  Marland Kitchen gabapentin (NEURONTIN) 100 MG capsule Take 1 capsule (100 mg total) by mouth 2 (two) times daily.  Marland Kitchen levothyroxine (SYNTHROID, LEVOTHROID) 50 MCG tablet Take 50 mcg by mouth daily before breakfast.   . loratadine (CLARITIN) 10 MG tablet Take 10 mg by mouth daily.   . metoprolol succinate (TOPROL-XL) 25 MG 24 hr tablet Take 25 mg by mouth daily.  . mometasone-formoterol (DULERA) 200-5  MCG/ACT AERO Inhale 2 puffs into the lungs 2 (two) times daily.  Marland Kitchen nystatin (MYCOSTATIN) 100000 UNIT/ML suspension Take 5 mLs by mouth 4 (four) times daily.  Marland Kitchen omeprazole (PRILOSEC) 20 MG capsule Take 20 mg by mouth daily.  Marland Kitchen oxybutynin (DITROPAN-XL) 10 MG 24 hr tablet Take 10 mg by mouth daily at 6 PM.   . predniSONE (DELTASONE) 20 MG tablet Take 10-20 mg by mouth See admin instructions. Take 1 tablet twice daily for 3 days then take 1/2 tablet twice daily for 3 days then take 1 tablet daily for 3 days then stop  . umeclidinium bromide (INCRUSE ELLIPTA) 62.5 MCG/INH AEPB Inhale 1 puff into the lungs daily.  . vitamin B-12 (CYANOCOBALAMIN) 1000 MCG tablet Take 1,000 mcg by mouth daily.     Allergies: Allergies as of 03/10/2018 - Review Complete 03/10/2018  Allergen Reaction Noted  . Hydralazine Other (See Comments) and Shortness Of Breath 03/27/2016  . Mushroom extract complex Anaphylaxis, Shortness Of Breath, and Swelling 04/02/2016  . Aspirin Other (See Comments) 04/02/2016  . Percocet [oxycodone-acetaminophen] Itching 04/15/2016  . Morphine and related Nausea And Vomiting 03/27/2016   Past Medical History:  Diagnosis Date  . Arthritis    DDD, neck & low back   . Asthma   . Bleeding nose    tx with cauterized at time   . Cancer ([place]) 1990's   removed - from face, ? Pre CA  . CHF (congestive heart failure) ([place])   . Chronic kidney disease    stage 4- renal failure , stent in each kidney   . COPD (chronic obstructive pulmonary disease) ([place])   . Encounter for TB tine test 1955   treated /w medicine due to + tine test  . GERD (gastroesophageal reflux disease)   . Hypertension   . Hypothyroidism   . Presence of permanent cardiac pacemaker   . Stroke [facility]) 2010   had aphasia , [facility] at [facility]- all resolved     Family History: Father with Hx of asthma and MI, multiple children with DM  Social History: [place] in the [place] area, widowed, seven children, retired  Marine scientist, former smoker that quit 30+ years ago, no EtOH  Review of Systems: A complete ROS was negative except as per HPI.   Physical Exam: Blood pressure 140/60, pulse 70, temperature (!) 97.5 F (36.4 C), resp. rate 16, SpO2 99 %. Physical Exam  Constitutional: She is oriented to person, place, and time.  Pleasant, alert, cooperative elderly woman on Bipap  HENT:  Head: Normocephalic and atraumatic.  She has dried blood in her oropharynx and teeth.   Eyes: Conjunctivae and EOM are normal. No scleral icterus.  Cardiovascular: Normal rate, regular rhythm and normal heart sounds.  Pulmonary/Chest: She has wheezes. She has rales.  She has tachypnea and accessory muscle use.  Abdominal: Soft. Bowel sounds are normal. There is no tenderness. There is no rebound.  Musculoskeletal: She exhibits edema. She exhibits no tenderness.  1+ bilateral and symmetric edema. She has chronic right grip weakness, otherwise strength is intact. Right upper extremity AV fistula.  Neurological: She is alert and oriented to person, place, and time.  She is able to follow commands and provide a strong cough when asked to do so.  Skin: Skin is warm and dry.  Psychiatric: Mood and affect normal.     EKG: personally reviewed my interpretation is NSR, rate 75, T-wave inversions in lateral leads appear new  CXR: personally reviewed my interpretation is bilateral airspace opacities c/w pneumonia or asymmetric edema.  Assessment & Plan by Problem: Principal Problem:   Pneumonia Active Problems:   ESRD (end stage renal disease) on dialysis (HCC)   CAD (coronary artery disease)   Atrial fibrillation (HCC)   Elevated troponin   Respiratory failure with hypoxia (HCC)   COPD (chronic obstructive pulmonary disease) (HCC)   Cough with hemoptysis  Acute on Chronic Hypoxemic Respiratory Failure likely secondary to Pneumonia vs Volume Overload Possible Sepsis secondary to above Hemoptysis in setting of recent  cough and hospitalization Dyspnea She is currently on BiPap with an ABG that shows hypoxemia and possibly a developing respiratory alkalosis.  Current oxygen saturation is in the upper 90's but she does exhibit increased work of breathing. Her respiratory failure is likely due to the pneumonia vs volume overload seen on chest x-ray.  Fortunately, her lactic acid is normal and she is currently hemodynamically stable. Her hemoptysis is concerning and Wells score for PE is 2.5 for this as well as recent immobilization.  She is not tachycardic but also is on nodal blocking agents.  This places her at moderate risk for PE. - Continue Vancomycin and Cefepime for now. - Procalcitonin - Continue BiPap, wean as tolerated - Duonebs scheduled and Albuterol PRN - CTA to evaluate for PE is contraindicated given she still has some kidney function producing urine and is fairly new to HD.  D-dimer is 1.6.  Suspect that dyspnea is more related to pneumonia vs volume than PE.  Will follow respiratory status following HD and antibiotics and consider further PE work up if she does not improve or worsens - PCCM Consult to evaluate her ability to maintain work of breathing as well as new hemoptysis - Repeat CXR in the morning  ESRD She is new to HD as of Sept 2018.  She still makes urine and is on Lasix BID. - Nephrology consulted, appreciate their assistance with arranging HD inpatient - Hold Lasix, due for HD today  Elevated Troponin with T-Wave Inversions in V5-V6 CAD s/p DES August 2018 Atrial Fibrillation not on AC HTN, HLD Her troponin is elevated at 0.11 which is not unexpected given her ESRD and hypoxemia.  She also has some new T-wave inversions in the lateral leads and known CAD with recent stenting in August 2018.  During that hospitalization, she had atrial fibrillation and was placed on amiodarone.  Per review of the outside record, she was to continue amiodarone with probable plans to taper down this  summer if she remains in sinus rhythm.  She is otherwise not on anticoagulation. - Repeat EKG to better assess lateral leads - Troponin x 3 - Continue amiodarone 200mg  daily - Holding Plavix per PCCM recs - Continue atorvastatin 40mg   daily - Hold metoprolol   COPD Asthma Home medications are albuterol PRN, Dulera, and Incruse Ellipta.  She has recently been on steroids as an outpatient and given SoluMedrol in the ED - Duonebs and Albuterol as above - Continue Dulera - Bipap, wean as able  GERD - Continue PPI  Hypothyroidism - Continue levothyroxine  FEN Fluids: none  Electrolytes: Monitor electrolytes with HD Nutrition: NPO while on Bipap  DVT PPx: Heparin SQ  CODE: Partial code.  Would be okay with intubation.  Does not want Chest compressions.   Dispo: Admit patient to Inpatient with expected length of stay greater than 2 midnights.  SignedJule Ser, DO 03/10/2018, 12:00 PM  Pager: [phone]

## 2018-03-10 NOTE — Progress Notes (Signed)
Pt with increased WOB, placed back on bipap at this time per her request.

## 2018-03-11 ENCOUNTER — Inpatient Hospital Stay (HOSPITAL_COMMUNITY): Payer: Medicare HMO

## 2018-03-11 DIAGNOSIS — I2510 Atherosclerotic heart disease of native coronary artery without angina pectoris: Secondary | ICD-10-CM

## 2018-03-11 DIAGNOSIS — I4891 Unspecified atrial fibrillation: Secondary | ICD-10-CM

## 2018-03-11 DIAGNOSIS — J9621 Acute and chronic respiratory failure with hypoxia: Secondary | ICD-10-CM

## 2018-03-11 DIAGNOSIS — Z992 Dependence on renal dialysis: Secondary | ICD-10-CM

## 2018-03-11 DIAGNOSIS — B370 Candidal stomatitis: Secondary | ICD-10-CM

## 2018-03-11 DIAGNOSIS — R1310 Dysphagia, unspecified: Secondary | ICD-10-CM

## 2018-03-11 DIAGNOSIS — Z955 Presence of coronary angioplasty implant and graft: Secondary | ICD-10-CM

## 2018-03-11 DIAGNOSIS — E8770 Fluid overload, unspecified: Secondary | ICD-10-CM

## 2018-03-11 DIAGNOSIS — Z79890 Hormone replacement therapy (postmenopausal): Secondary | ICD-10-CM

## 2018-03-11 DIAGNOSIS — J189 Pneumonia, unspecified organism: Secondary | ICD-10-CM

## 2018-03-11 DIAGNOSIS — Z7982 Long term (current) use of aspirin: Secondary | ICD-10-CM

## 2018-03-11 DIAGNOSIS — E039 Hypothyroidism, unspecified: Secondary | ICD-10-CM

## 2018-03-11 DIAGNOSIS — E785 Hyperlipidemia, unspecified: Secondary | ICD-10-CM

## 2018-03-11 DIAGNOSIS — J449 Chronic obstructive pulmonary disease, unspecified: Secondary | ICD-10-CM

## 2018-03-11 DIAGNOSIS — N186 End stage renal disease: Secondary | ICD-10-CM

## 2018-03-11 DIAGNOSIS — Z7902 Long term (current) use of antithrombotics/antiplatelets: Secondary | ICD-10-CM

## 2018-03-11 DIAGNOSIS — I120 Hypertensive chronic kidney disease with stage 5 chronic kidney disease or end stage renal disease: Secondary | ICD-10-CM

## 2018-03-11 DIAGNOSIS — Z9981 Dependence on supplemental oxygen: Secondary | ICD-10-CM

## 2018-03-11 DIAGNOSIS — Z79899 Other long term (current) drug therapy: Secondary | ICD-10-CM

## 2018-03-11 LAB — CBC WITH DIFFERENTIAL/PLATELET
Basophils Absolute: 0 10*3/uL (ref 0.0–0.1)
Basophils Relative: 0 %
EOS ABS: 0 10*3/uL (ref 0.0–0.7)
Eosinophils Relative: 0 %
HCT: 27.8 % — ABNORMAL LOW (ref 36.0–46.0)
HEMOGLOBIN: 8.7 g/dL — AB (ref 12.0–15.0)
LYMPHS ABS: 1.3 10*3/uL (ref 0.7–4.0)
LYMPHS PCT: 7 %
MCH: 30.3 pg (ref 26.0–34.0)
MCHC: 31.3 g/dL (ref 30.0–36.0)
MCV: 96.9 fL (ref 78.0–100.0)
MONOS PCT: 12 %
Monocytes Absolute: 2.2 10*3/uL — ABNORMAL HIGH (ref 0.1–1.0)
NEUTROS PCT: 81 %
Neutro Abs: 15 10*3/uL — ABNORMAL HIGH (ref 1.7–7.7)
Platelets: 184 10*3/uL (ref 150–400)
RBC: 2.87 MIL/uL — AB (ref 3.87–5.11)
RDW: 15.7 % — ABNORMAL HIGH (ref 11.5–15.5)
WBC: 18.5 10*3/uL — ABNORMAL HIGH (ref 4.0–10.5)

## 2018-03-11 LAB — RENAL FUNCTION PANEL
ANION GAP: 10 (ref 5–15)
Albumin: 2.2 g/dL — ABNORMAL LOW (ref 3.5–5.0)
BUN: 30 mg/dL — ABNORMAL HIGH (ref 6–20)
CALCIUM: 7.8 mg/dL — AB (ref 8.9–10.3)
CHLORIDE: 97 mmol/L — AB (ref 101–111)
CO2: 28 mmol/L (ref 22–32)
Creatinine, Ser: 2.26 mg/dL — ABNORMAL HIGH (ref 0.44–1.00)
GFR calc non Af Amer: 19 mL/min — ABNORMAL LOW (ref >60)
GFR, EST AFRICAN AMERICAN: 22 mL/min — AB (ref >60)
GLUCOSE: 174 mg/dL — AB (ref 65–99)
Phosphorus: 2.7 mg/dL (ref 2.5–4.6)
Potassium: 4.3 mmol/L (ref 3.5–5.1)
Sodium: 135 mmol/L (ref 135–145)

## 2018-03-11 LAB — PROCALCITONIN: PROCALCITONIN: 0.44 ng/mL

## 2018-03-11 LAB — GLUCOSE, CAPILLARY
GLUCOSE-CAPILLARY: 100 mg/dL — AB (ref 65–99)
Glucose-Capillary: 188 mg/dL — ABNORMAL HIGH (ref 65–99)
Glucose-Capillary: 202 mg/dL — ABNORMAL HIGH (ref 65–99)

## 2018-03-11 LAB — TROPONIN I
TROPONIN I: 1.01 ng/mL — AB (ref <0.03)
Troponin I: 0.42 ng/mL (ref <0.03)

## 2018-03-11 LAB — MAGNESIUM: Magnesium: 1.6 mg/dL — ABNORMAL LOW (ref 1.7–2.4)

## 2018-03-11 LAB — HEPATITIS B SURFACE ANTIGEN: Hepatitis B Surface Ag: NEGATIVE

## 2018-03-11 MED ORDER — METHYLPREDNISOLONE SODIUM SUCC 125 MG IJ SOLR
60.0000 mg | Freq: Every day | INTRAMUSCULAR | Status: DC
  Administered 2018-03-11 – 2018-03-12 (×2): 60 mg via INTRAVENOUS
  Filled 2018-03-11 (×2): qty 2

## 2018-03-11 MED ORDER — MAGNESIUM SULFATE 2 GM/50ML IV SOLN
2.0000 g | Freq: Once | INTRAVENOUS | Status: AC
  Administered 2018-03-11: 2 g via INTRAVENOUS
  Filled 2018-03-11: qty 50

## 2018-03-11 MED ORDER — SODIUM CHLORIDE 0.9 % IV SOLN
1.0000 g | INTRAVENOUS | Status: DC
  Administered 2018-03-12: 1 g via INTRAVENOUS
  Filled 2018-03-11 (×2): qty 1

## 2018-03-11 MED ORDER — HEPARIN SODIUM (PORCINE) 1000 UNIT/ML DIALYSIS: 1000.0000 [IU] | INTRAMUSCULAR | Status: DC | PRN

## 2018-03-11 MED ORDER — LIDOCAINE HCL (PF) 1 % IJ SOLN: 5.0000 mL | INTRAMUSCULAR | Status: DC | PRN

## 2018-03-11 MED ORDER — FLUCONAZOLE 200 MG PO TABS
200.0000 mg | ORAL_TABLET | ORAL | Status: DC
  Administered 2018-03-11 – 2018-03-13 (×2): 200 mg via ORAL
  Filled 2018-03-11 (×2): qty 1

## 2018-03-11 MED ORDER — ALTEPLASE 2 MG IJ SOLR: 2.0000 mg | Freq: Once | INTRAMUSCULAR | Status: DC | PRN

## 2018-03-11 MED ORDER — SODIUM CHLORIDE 0.9 % IV SOLN: 100.0000 mL | INTRAVENOUS | Status: DC | PRN

## 2018-03-11 MED ORDER — SODIUM CHLORIDE 0.9 % IV SOLN: 2.0000 g | INTRAVENOUS | Status: DC

## 2018-03-11 MED ORDER — LIDOCAINE-PRILOCAINE 2.5-2.5 % EX CREA: 1.0000 "application " | TOPICAL_CREAM | CUTANEOUS | Status: DC | PRN

## 2018-03-11 MED ORDER — CLOPIDOGREL BISULFATE 75 MG PO TABS
75.0000 mg | ORAL_TABLET | Freq: Every day | ORAL | Status: DC
  Administered 2018-03-11 – 2018-03-13 (×3): 75 mg via ORAL
  Filled 2018-03-11 (×3): qty 1

## 2018-03-11 MED ORDER — METHYLPREDNISOLONE SODIUM SUCC 125 MG IJ SOLR: 60.0000 mg | Freq: Every day | INTRAMUSCULAR | Status: DC

## 2018-03-11 MED ORDER — PENTAFLUOROPROP-TETRAFLUOROETH EX AERO: 1.0000 "application " | INHALATION_SPRAY | CUTANEOUS | Status: DC | PRN

## 2018-03-11 NOTE — Progress Notes (Signed)
  Date: 03/11/2018  Patient name: Stacie Roman  Medical record number: [id number]  Date of birth: 1931/06/29   I have seen and evaluated Stacie Roman and discussed their care with the Residency Team. Stacie Roman is an 84 yo woman, one month ago able to walk fully around home without dyspnea. 3 weeks ago, with onset of pollen, she developed DOE. Admitted to [facility] last week for COPD exac and treated with BiPAP, high dose steroids, and nebs. Vanc was started 5/6 at HD for possible PNA. She feels she never improved and presented to [facility] for dyspnea, cough, hemoptysis. She is on O2 2 L at bedtime but was increased to all day after [facility] D/C.  PMHx : ESRD on HD, CAD, COPD, A Fib, HTN, HLD.  Afebrile HR 70 RR 17-32 130/53 100% om BiPAP Thrush on hard palate HRRR no MRG L wheezing but decent air flow Using accessory muscles to breath & appears dyspneic  Cr 4- 2.26 CO2 28 PCT 0.25 - 0.44 WBC 26 - 18 HgB 8.7 ABG 7.4 / 34 / 84  I personally viewed the CXR images and confirmed my reading with the official read.  1 view on admit 2 view, AP and lateral today. Both show ICD L chest and infiltrates both lung fields.  I personally viewed the EKG and confirmed my reading with the official read. Sinus, nl axis, NSTWC lateral leads  PFT's 01/21/2018 : FEV1/FVC 64. FEV1 65%, TLC 84, DLCO 55 : c/w COPD   Assessment and Plan: I have seen and evaluated the patient as outlined above. I agree with the formulated Assessment and Plan as detailed in the residents' note, with the following changes: Stacie Roman is an 84 yo female with COPD O2 requiring and ESRD. She was recently tx at [facility] for a COPD exac and D/C'd on 80 BID prednisone. Pt cont to feel poorly. HD started Vanc for presumed PNA on 5/6. Pt now with hypoxia, accessory muscle usage. She does not have CO2 retention either on admission or over 4 ABG's in 2018. She is receiving ABX, steroids, nebs, and HD for vol control but is slow to respond.  1. Acute  on chronic hypoxic resp failure - appreciate CCM/pul assistance. They broadened ABX. BiPAP PRN. Cont steroids and nebs. She may take a while to respond but it is encouraging that 4 weeks ago able to walk circle in home without DOE.   2. Thrush with dysphagia - 2/2 steroids and ABX. Diflucan started.  3. ESRD - HD per renal  4. CAD - resume placix   Bartholomew Crews, MD 5/9/20192:49 PM

## 2018-03-11 NOTE — Progress Notes (Signed)
Subjective:  Patient seen sitting comfortably in bed this AM in no acute distress. Patient states that she feels overall poor and cannot catch her breath. Patient also states she is unable to eat anything 2/2 mouth pain. NO other acute complaints.   Objective:  Vital signs in last 24 hours: Vitals:   03/11/18 0018 03/11/18 0350 03/11/18 0454 03/11/18 0746  BP: (!) 121/42 (!) 125/51 (!) 135/51 (!) 138/44  Pulse:   62 71  Resp:   17   Temp: 98.8 F (37.1 C) 98.3 F (36.8 C) 98.7 F (37.1 C) 99 F (37.2 C)  TempSrc: Oral Oral Oral Oral  SpO2: 92% 94% 92% 91%  Height:       Physical Exam  Constitutional: She is oriented to person, place, and time. She appears well-developed and well-nourished. No distress.  HENT:  Mouth/Throat: Oropharynx is clear and moist.  Small areas of white patches on posterior oropharynx, no ulcerations.  Eyes: Conjunctivae and EOM are normal.  Cardiovascular: Normal rate, regular rhythm and intact distal pulses. Exam reveals no friction rub.  No murmur heard. Respiratory:  Patient stably saturating >92% on 3L [place] while in room. Mild accessory muscle use and difficulty speaking in full sentences on exam. No cyanosis or nasal flaring. Good air movement. No crackles. Intermittent wheezing.   GI: Soft. Bowel sounds are normal. She exhibits no distension. There is no tenderness.  Musculoskeletal: She exhibits edema (trace -1+ pitting edema to mid shins bilaterally). She exhibits no tenderness (trace -1+ pitting edema to mid shins bilaterally).  Neurological: She is alert and oriented to person, place, and time.  Face strength and sensation intact bilaterally. Tongue midline.Gross motor and sensation to light touch of upper and lower extremities intact bilaterally.   Skin: Skin is warm and dry. No rash noted. She is not diaphoretic. No erythema.   Assessment/Plan:  Principal Problem:   Pneumonia Active Problems:   ESRD (end stage renal disease) on dialysis  (HCC)   CAD (coronary artery disease)   Atrial fibrillation (HCC)   Elevated troponin   Respiratory failure with hypoxia (HCC)   COPD (chronic obstructive pulmonary disease) (HCC)   Cough with hemoptysis   Pressure injury of skin  Acute on Chronic Hypoxemic Respiratory Failure with hypoxia 2/2 pneumonia, volume overload: Cause of patient's hypoxia on admission multifactorial, but patient continues to maintain O2 saturations >92% on 3L [place] after HD yesterday. Still mildly volume overloaded on PE and unchanged CXR. Patient's procalcitonin this AM = 0.44, so will continue antibiotics but narrow to ceftriaxone to cover for organisms causing typical pneumonia. Will continue supportive care for breathing, including steroids, scheduled breathing treatments, and BiPAP PRN for increased work of breathing. Given that hypoxia improving near baseline with dialysis, do not think additional workup for pulmonary embolism needed at this time.  -PCCM consulted and appreciated -IV ceftriaxone for treatment of CAP, day 2 antibiotics -Scheduled DuoNebs, albuterol PRN, and solumedrol 60 mg daily -Continue home Dulera for hx of COPD -Wean to room air as tolerated  ESRD on HD MWF as of 07/2017: Patient still makes urine and is on Lasix BID. Holding lasix now given that she received HD today and has ongoing respiratory distress. -Hold lasix while undergoing HD while inpatient  CAD s/p DES August 2018, A-Fib not on [facility]: Patient's troponin peaked around 1.0 last night and was downtrending this morning. Patient without CP, suggesting that troponin elevation 2/2 demand ischemia given above and underlying CAD. Patient had DES placed in August  and plavix held during admission 2/2 hemoptysis. Given that hemoptysis has resolved and increased risk of stent reoclusion off plavix, will resume this medication this AM.  -Continue amiodarone 200mg  daily -Restart clopidogrel 75 mg daily -Continue atorvastatin 40mg , aspirin 81 mg  daily  Oral thrush: Patient states she has pain in throat that limits her ability to eat and mainly complaining of this symptom this AM. -Oral fluconazole 200 mg q48 hours for treatment of oral thrush, possible esophagitis (day 1 today) -Magic mouthwash x4 hours PRN  Hypothyroidism: Chronic, stable. Continue home levothyroxine 50 mg daily  FEN/GI: -Renal diet with 1.2L volume restriction -No IVF, replace electrolytes as needed -Home Protonix 40 mg daily  VTE Prophylaxis: Heparin TID Code Status: Partial - do not intubate  Dispo: Anticipated discharge in approximately 2-3 day(s).   Thomasene Ripple, MD 03/11/2018, 8:38 AM Pager: [phone]

## 2018-03-11 NOTE — Progress Notes (Signed)
Pharmacy Antibiotic Note  Stacie Roman is a 84 y.o. female admitted on 03/10/2018 with pneumonia.  Pharmacy has been consulted for cefepime dosing. Patient has a history of CKD on MWF HD. WBC down to 18.5. Afebrile. Pt last HD on 5/8, plan to have extra HD today.  Plan: -Continue cefepime 1 gm IV Q 24 hours (continue q24 until back to normal HD schedule) -Vancomycin discontinued -Monitor CBC, cultures, clinical progress, and plan   Height: 5\' 2"  (157.5 cm) Weight: (pt on ED stretcher and unable to stand for weight.) IBW/kg (Calculated) : 50.1  Temp (24hrs), Avg:98.3 F (36.8 C), Min:97.5 F (36.4 C), Max:99 F (37.2 C)  Recent Labs  Lab 03/10/18 0555 03/10/18 0844 03/11/18 0057 03/11/18 0058  WBC 26.3*  --  18.5*  --   CREATININE 4.05*  --   --  2.26*  LATICACIDVEN  --  1.76  --   --     CrCl cannot be calculated (Unknown ideal weight.).    Allergies  Allergen Reactions  . Hydralazine Other (See Comments) and Shortness Of Breath    Dizzy, lightheaded Knocks her out  . Mushroom Extract Complex Anaphylaxis, Shortness Of Breath and Swelling    Throat swelling  . Aspirin Other (See Comments)    Bleeding disorder  . Percocet [Oxycodone-Acetaminophen] Itching  . Morphine And Related Nausea And Vomiting    Antimicrobials this admission: Vanc 5/6 >> 5/8 Cefepime 5/8 >>  Dose adjustments this admission: None  Microbiology results: 5/8 MRSA PCR negative 5/8 BCx sent   Thank you for allowing [place] to participate in this patients care.  Jens Som, PharmD Clinical phone for 03/11/2018 from 7a-3:30p: [phone] If after 3:30p, please call main pharmacy at: [phone] 03/11/2018 11:53 AM

## 2018-03-11 NOTE — Procedures (Signed)
Patient was seen on dialysis and the procedure was supervised.  BFR 150- just getting started  Via AVG BP is  130/53.   Patient just getting started on tx- is on BIPAP- UF as able- this is an extra treatment   Louay Myrie A 03/11/2018

## 2018-03-11 NOTE — Progress Notes (Signed)
PCCM Progress Note  Admission date: 03/10/2018 Referring provider: Dr. Lynnae January CC: Hemoptysis  HPI: 84 yo female with dyspnea, hypoxia, hemoptysis from pulmonary edema and pneumonia.  Hx of ESRD, COPD, HTN, HLD, CAD s/p stent, GERD, PAF.  Subjective: Still short of breath and coughing phlegm.  No further hemoptysis.  Vital signs: BP (!) 138/44 (BP Location: Left Arm)   Pulse 71   Temp 99 F (37.2 C) (Oral)   Resp 17   Ht 5\' 2"  (1.575 m)   SpO2 91%   BMI 39.95 kg/m   Intake/outpt: I/O last 3 completed shifts: In: 220 [P.O.:120; IV Piggyback:100] Out: 3300 [Urine:300; Other:3000]  Physical exam:  General - pleasant Eyes - pupils reactive ENT - no sinus tenderness, no oral exudate, no LAN Cardiac - regular, no murmur Chest - no wheeze, rales Abd - soft, non tender Ext - no edema Skin - no rashes Neuro - normal strength Psych - normal mood  Labs: CBC Recent Labs    03/10/18 0555 03/11/18 0057  WBC 26.3* 18.5*  HGB 9.8* 8.7*  HCT 31.7* 27.8*  PLT 233 184    Coag's Recent Labs    03/10/18 1858  INR 1.11    BMET Recent Labs    03/10/18 0555 03/11/18 0058  NA 137 135  K 4.9 4.3  CL 102 97*  CO2 21* 28  BUN 74* 30*  CREATININE 4.05* 2.26*  GLUCOSE 180* 174*    Electrolytes Recent Labs    03/10/18 0555 03/11/18 0057 03/11/18 0058  CALCIUM 8.5*  --  7.8*  MG  --  1.6*  --   PHOS  --   --  2.7    Sepsis Markers Recent Labs    03/10/18 1115 03/11/18 0057  PROCALCITON 0.25 0.44    ABG Recent Labs    03/10/18 0825  PHART 7.422  PCO2ART 34.2  PO2ART 84.0    Liver Enzymes Recent Labs    03/11/18 0058  ALBUMIN 2.2*    Cardiac Enzymes Recent Labs    03/10/18 1115 03/10/18 1858 03/11/18 0057  TROPONINI 0.42* 1.23* 1.01*    Glucose Recent Labs    03/10/18 1730 03/10/18 2140 03/11/18 0744  GLUCAP 124* 189* 100*    Imaging Dg Chest Portable 1 View  Result Date: 03/10/2018 CLINICAL DATA:  Shortness of breath.   History of asthma-COPD, CHF. EXAM: PORTABLE CHEST 1 VIEW COMPARISON:  Chest x-ray of Mar 09, 2018 FINDINGS: The lungs are hyperinflated. The interstitial markings remain increased but have improved slightly. Confluent airspace opacities persist bilaterally. There is a small left pleural effusion. The heart is top-normal in size. The pulmonary vascularity is engorged and indistinct. There is calcification in the wall of the thoracic aorta. The ICD is in stable position. IMPRESSION: Slight interval improvement in the appearance of the pulmonary interstitium. Bilateral airspace opacities compatible with pneumonia or asymmetric pulmonary edema. Small left pleural effusion, stable. Stable cardiomegaly. Electronically Signed   By: David  Martinique M.D.   On: 03/10/2018 06:51    Cultures: Blood 5/08 >>   Antibiotics: Rocephin 5/08 > 5/09 Cefepime 5/09 >  Assessment/plan:  Acute hypoxic respiratory failure. - oxygen to keep SpO2 90 to 95% - Bipap prn  HCAP. - change ABx to cefepime  ESRD with pulmonary edema on CXR. - HD per renal  Hemoptysis. - from HCAP, pulmonary edema - should be okay to resume plavix  COPD. - continue BDs  Chesley Mires, MD [facility] Pulmonary/Critical Care 03/11/2018, 11:09 AM

## 2018-03-11 NOTE — Progress Notes (Signed)
[facility] Progress Note    Assessment/ Plan:   84 y/o F with PMH of ESRD on HD 2/2 HTN-  dialysis MWF in [place], COPD/asthma, hypertension, hyperlipidemia, CAD s/p stenting in August 2018, OA, GERD, paroxysmal A. Fib.   P/w shortness of breath that started last night and progressively worsened into today.  Leukocytosis to 26, and reporting a productive cough.  No fevers, chills, chest pain, abdominal pain.  CXR in ED with bilateral airspace opacities concerning for pneumonia.  Started on Vancomycin and cefepime and bcx obtained.  Nephrology consulted for dialysis.   1. ESRD on HD 2/2 HTN- receives dialysis MWF in [place].  SCr 4.05>2.26, labs improved with dialysis yesterday. Good UOP of 3.3L in 24h.  Plan for repeat HD this morning however, as she still does not look comfortable.   2. HTN- well controlled   4. Hgb: 8.7, stable, continue to monitor  5. COPD/asthma 6. HLD 7. CAD  8. OA 9. GERD 10. paroxysmal A. Fib.   Subjective:   Patient appears mildly short of breath on 3L per [place], reports feeling unwell.  Denies CP, palpitations.  No overnight events.    Objective:   BP (!) 138/44 (BP Location: Left Arm)   Pulse 71   Temp 99 F (37.2 C) (Oral)   Resp 17   Ht 5\' 2"  (1.575 m)   SpO2 91%   BMI 39.95 kg/m   Intake/Output Summary (Last 24 hours) at 03/11/2018 1026 Last data filed at 03/11/2018 0456 Gross per 24 hour  Intake 120 ml  Output 3300 ml  Net -3180 ml   Weight change:   Physical Exam: General: 84 year old elderly female, on BiPAP  Neuro: Alert, oriented x4, no focal deficits  HEENT: NCAT, EOMI, oropharynx clear, MMM Cardiovascular: RRR no MRG Lungs: Decreased breath sounds bilateral bases Abdomen:, NTND, positive bowel sounds Musculoskeletal: Normal range of motion x4 Skin:AV graft right antecubital fossa positive bruit and thrill   Imaging: Dg Chest Portable 1 View  Result Date: 03/10/2018 CLINICAL DATA:  Shortness of breath.   History of asthma-COPD, CHF. EXAM: PORTABLE CHEST 1 VIEW COMPARISON:  Chest x-ray of Mar 09, 2018 FINDINGS: The lungs are hyperinflated. The interstitial markings remain increased but have improved slightly. Confluent airspace opacities persist bilaterally. There is a small left pleural effusion. The heart is top-normal in size. The pulmonary vascularity is engorged and indistinct. There is calcification in the wall of the thoracic aorta. The ICD is in stable position. IMPRESSION: Slight interval improvement in the appearance of the pulmonary interstitium. Bilateral airspace opacities compatible with pneumonia or asymmetric pulmonary edema. Small left pleural effusion, stable. Stable cardiomegaly. Electronically Signed   By: David  Martinique M.D.   On: 03/10/2018 06:51    Labs: BMET Recent Labs  Lab 03/10/18 0555 03/11/18 0058  NA 137 135  K 4.9 4.3  CL 102 97*  CO2 21* 28  GLUCOSE 180* 174*  BUN 74* 30*  CREATININE 4.05* 2.26*  CALCIUM 8.5* 7.8*  PHOS  --  2.7   CBC Recent Labs  Lab 03/10/18 0555 03/11/18 0057  WBC 26.3* 18.5*  NEUTROABS  --  15.0*  HGB 9.8* 8.7*  HCT 31.7* 27.8*  MCV 98.4 96.9  PLT 233 184    Medications:    . amiodarone  200 mg Oral Daily  . aspirin EC  81 mg Oral Daily  . atorvastatin  40 mg Oral q1800  . clopidogrel  75 mg Oral Daily  . fluconazole  200  mg Oral Q48H  . heparin  5,000 Units Subcutaneous Q8H  . ipratropium-albuterol  3 mL Nebulization Q6H  . levothyroxine  50 mcg Oral QAC breakfast  . methylPREDNISolone (SOLU-MEDROL) injection  60 mg Intravenous Daily  . mometasone-formoterol  2 puff Inhalation BID  . nystatin  5 mL Oral QID  . pantoprazole  40 mg Oral Daily  . sodium chloride flush  3 mL Intravenous Q12H     Lovenia Kim, MD [place], PGY-2

## 2018-03-11 NOTE — Progress Notes (Signed)
Pt placed on BIPAP due increased work of breathing.  Pt tolerating well at this time.  RT will continue to monitor.

## 2018-03-12 DIAGNOSIS — J9621 Acute and chronic respiratory failure with hypoxia: Secondary | ICD-10-CM

## 2018-03-12 LAB — GLUCOSE, CAPILLARY
GLUCOSE-CAPILLARY: 103 mg/dL — AB (ref 65–99)
Glucose-Capillary: 163 mg/dL — ABNORMAL HIGH (ref 65–99)
Glucose-Capillary: 216 mg/dL — ABNORMAL HIGH (ref 65–99)
Glucose-Capillary: 159 mg/dL — ABNORMAL HIGH (ref 65–99)

## 2018-03-12 LAB — PROCALCITONIN: PROCALCITONIN: 0.78 ng/mL

## 2018-03-12 MED ORDER — FUROSEMIDE 40 MG PO TABS
40.0000 mg | ORAL_TABLET | Freq: Two times a day (BID) | ORAL | Status: DC
  Administered 2018-03-12 (×2): 40 mg via ORAL
  Filled 2018-03-12 (×2): qty 1

## 2018-03-12 NOTE — Progress Notes (Signed)
Medical Medication Related Note    Vital Signs: Temp: 98.4 F (36.9 C) (05/10 1134) Temp Source: Oral (05/10 1134) BP: 143/46 (05/10 1134) Pulse Rate: 71 (05/10 1134)   Labs: Recent Labs    03/10/18 0555 03/11/18 0057 03/11/18 0058  WBC 26.3* 18.5*  --   HGB 9.8* 8.7*  --   HCT 31.7* 27.8*  --   PLT 233 184  --   CREATININE 4.05*  --  2.26*  MG  --  1.6*  --   PHOS  --   --  2.7  ALBUMIN  --   --  2.2*   Estimated Creatinine Clearance: 16.3 mL/min (A) (by C-G formula based on SCr of 2.26 mg/dL (H)).    Medications:  Scheduled:  . amiodarone  200 mg Oral Daily  . aspirin EC  81 mg Oral Daily  . atorvastatin  40 mg Oral q1800  . clopidogrel  75 mg Oral Daily  . fluconazole  200 mg Oral Q48H  . furosemide  40 mg Oral BID  . heparin  5,000 Units Subcutaneous Q8H  . ipratropium-albuterol  3 mL Nebulization Q6H  . levothyroxine  50 mcg Oral QAC breakfast  . methylPREDNISolone (SOLU-MEDROL) injection  60 mg Intravenous Daily  . mometasone-formoterol  2 puff Inhalation BID  . nystatin  5 mL Oral QID  . pantoprazole  40 mg Oral Daily  . sodium chloride flush  3 mL Intravenous Q12H   Pharmacy Observation:  Date: 03/12/2018    Stacie Roman is a 84 yo F. PMx significant for paroxysmal atrial fibrillation, stroke (2010), COPD, Asthma, ESRD w/ HD (MWF), HTN, HLD, CAD s/p stenting, GERD, CHF (HFpEF 60-65%), and cancer (1990). Current antiplatelet therapy includes Aspirin 81 mg QD and Clopidogrel 75 mg QD. Renal stents that were placed in 2016 is an indication for dual antiplatelet therapy. However, anticoagulation therapy is indicated for atrial fibrillation. Current CHA2DS2-VASc score is 7. Current HAS-BLED score is 5.  Reasonable options to reduce stroke risk in atrial fibrillation patients currently on HD: Aspirin; Aspirin + Clopidogrel; Warfarin; and a DOAC (Apixaban). On current therapy annual risk of stroke/thromboembolism is 7.8%, RRR is 44%, ARR is 6.2%, and NNT is 16 patients.  Apixaban is an option for anticoagulation on HD. The annual risk of stroke/thromboembolism is 3.6%, RRR is 74%, ARR is 10.4%, and NNT is 10. Patients annual chance of being harmed by Apixaban (5mg  QD) is 1 in 19, whereas for Aspirin+Clopidogrel the annual chance of being harmed is 1 in 31 patients. Warfarin has similar data as to Apixaban, but requires stricter monitoring parameters. Given Stacie Roman's stroke risk reduction currently (7.8%) by using Clopidogrel + Aspirin, it is an option her regimen be changed to Apixaban 2.5mg  BID. Given her history of renal stent placement in 2016, Aspirin as a single antiplatelet agent at its lowest dose would still be indicated-but removal of the clopidogrel would lessen the potential for antiplatelet mediated hemorrhagic potential.  Stacie Roman would require the lower dose of Apixaban due to her age (>82) and patients Cr (>1.5), which would lower the bleeding risk further.   Information source: Anticoagulation Evaluation App   Marlene Lard, PharmD Candidate Benay Pillow, PharmD [place] 03/12/2018,3:03 PM

## 2018-03-12 NOTE — Evaluation (Signed)
Physical Therapy Evaluation Patient Detailse Name: Stacie Roman MRN: [id number] DOB: 1930-12-26  Today's Date: 03/12/2018   History of Present Illness  84 yo female with onset of recent PNA and now elevated troponin of 1.01, with respiratory failure/hypoxia and hemoptysis. PMHx:  HTN, CAD, stents, ESRD, PAF, CHF, COPD, stroke, asthma, pacemaker  Clinical Impression  Pt was seen for assessment of her mobility on a platform walker with R arm weakness from residual injury from her dialysis shunt.  Pt is expecting to be considered for SNF admission to monitor her mobility as she was down to 91% sat on 2L O2 at rest, rapidly declining past 88% with standing effort.  Ran up to 3L O2 for gait and then could maintain her sats at 95% to walk with limited talking.  On 2L at rest returned to 91% O2 sats.    Follow Up Recommendations SNF    Equipment Recommendations  None recommended by PT    Recommendations for Other Services       Precautions / Restrictions Precautions Precautions: Fall Restrictions Weight Bearing Restrictions: No      Mobility  Bed Mobility               General bed mobility comments: up in chair when PT arrived  Transfers Overall transfer level: Needs assistance Equipment used: 1 person hand held assist;Right platform walker Transfers: Sit to/from Stand Sit to Stand: Min assist         General transfer comment: pt cued for hand placement and min assist to power up  Ambulation/Gait Ambulation/Gait assistance: Min guard;Min assist Ambulation Distance (Feet): 80 Feet Assistive device: Right platform walker;1 person hand held assist Gait Pattern/deviations: Step-through pattern;Step-to pattern;Shuffle;Decreased stride length;Trunk flexed Gait velocity: reduced Gait velocity interpretation: <1.8 ft/sec, indicate of risk for recurrent falls General Gait Details: pt was able to turn the walker but needed two standing rests for her drops in O2  sats  Stairs            Wheelchair Mobility    Modified Rankin (Stroke Patients Only)       Balance Overall balance assessment: Needs assistance Sitting-balance support: Feet supported Sitting balance-Leahy Scale: Fair     Standing balance support: Bilateral upper extremity supported;During functional activity Standing balance-Leahy Scale: Poor                               Pertinent Vitals/Pain Pain Assessment: No/denies pain    Home Living Family/patient expects to be discharged to:: Private residence Living Arrangements: Children Available Help at Discharge: Family;Available 24 hours/day Type of Home: House Home Access: Stairs to enter     Home Layout: Multi-level;Other (Comment)(has stair lifts for changing floors) Home Equipment: Walker - 2 wheels;Cane - single point;Walker - 4 wheels Additional Comments: son has been caring for pt for a longer time    Prior Function Level of Independence: Needs assistance   Gait / Transfers Assistance Needed: min assist from son with platform attachment on R arm  ADL's / Homemaking Assistance Needed: son takes care of housework        Hand Dominance   Dominant Hand: Left    Extremity/Trunk Assessment   Upper Extremity Assessment Upper Extremity Assessment: Generalized weakness    Lower Extremity Assessment Lower Extremity Assessment: Generalized weakness    Cervical / Trunk Assessment Cervical / Trunk Assessment: Kyphotic  Communication   Communication: HOH  Cognition Arousal/Alertness: Awake/alert Behavior During Therapy:  WFL for tasks assessed/performed Overall Cognitive Status: Within Functional Limits for tasks assessed                                        General Comments      Exercises     Assessment/Plan    PT Assessment Patient needs continued PT services  PT Problem List Decreased strength;Decreased range of motion;Decreased activity tolerance;Decreased  balance;Decreased mobility;Decreased coordination;Decreased knowledge of use of DME;Decreased safety awareness;Cardiopulmonary status limiting activity;Obesity       PT Treatment Interventions DME instruction;Gait training;Functional mobility training;Therapeutic activities;Therapeutic exercise;Balance training;Neuromuscular re-education;Patient/family education    PT Goals (Current goals can be found in the Care Plan section)  Acute Rehab PT Goals Patient Stated Goal: to walk and feel stronger PT Goal Formulation: With patient/family Time For Goal Achievement: 03/26/18 Potential to Achieve Goals: Good    Frequency Min 2X/week   Barriers to discharge Other (comment)(has modifications at home)      Co-evaluation               AM-PAC PT "6 Clicks" Daily Activity  Outcome Measure Difficulty turning over in bed (including adjusting bedclothes, sheets and blankets)?: A Little Difficulty moving from lying on back to sitting on the side of the bed? : Unable Difficulty sitting down on and standing up from a chair with arms (e.g., wheelchair, bedside commode, etc,.)?: Unable Help needed moving to and from a bed to chair (including a wheelchair)?: A Little Help needed walking in hospital room?: A Little Help needed climbing 3-5 steps with a railing? : A Lot 6 Click Score: 13    End of Session Equipment Utilized During Treatment: Gait belt;Oxygen Activity Tolerance: Treatment limited secondary to medical complications (Comment)(required increase of O2 to 3L and dropped O2 sats to 89%) Patient left: in chair;with call bell/phone within reach;with family/visitor present Nurse Communication: Mobility status PT Visit Diagnosis: Unsteadiness on feet (R26.81);Muscle weakness (generalized) (M62.81);Difficulty in walking, not elsewhere classified (R26.2);Adult, failure to thrive (R62.7)    Time: 6073-7106 PT Time Calculation (min) (ACUTE ONLY): 28 min   Charges:   PT Evaluation $PT  Eval Moderate Complexity: 1 Mod PT Treatments $Gait Training: 8-22 mins   PT G Codes:   PT G-Codes **NOT FOR INPATIENT CLASS** Functional Assessment Tool Used: AM-PAC 6 Clicks Basic Mobility    Ramond Dial 03/12/2018, 10:12 PM   Mee Hives, PT MS Acute Rehab Dept. Number: [place]

## 2018-03-12 NOTE — Progress Notes (Signed)
RT found pt off Bipap and back on 4LNC.  SPO2 is 100 and RR in the low 20's.  Pt denies any discomfort or SOB at this time.

## 2018-03-12 NOTE — Progress Notes (Signed)
  Date: 03/12/2018  Patient name: Stacie Roman  Medical record number: [id number]  Date of birth: 18-Jan-1931   I have seen and evaluated this patient and I have discussed the plan of care with the house staff. Please see their note for complete details. I concur with their findings with the following additions/corrections: Looks much better today - perky, siting in recliner, less accessory muscle usage. Wean O2 to keep O2 sat 90 - 95%. ABX - will need to narrow over weekend. Steroid taper. Cont nebs. PT / OT. Cont thrush tx.  Bartholomew Crews, MD 03/12/2018, 1:32 PM

## 2018-03-12 NOTE — Progress Notes (Signed)
PCCM Progress Note  Admission date: 03/10/2018 Referring provider: Dr. Lynnae January CC: Hemoptysis  HPI: 84 yo female with dyspnea, hypoxia, hemoptysis from pulmonary edema and pneumonia.  Hx of ESRD, COPD, HTN, HLD, CAD s/p stent, GERD, PAF.  Subjective: Feels much better.  Didn't need Bipap.  Not as much cough or sputum.  Vital signs: BP (!) 143/46 (BP Location: Left Arm)   Pulse 71   Temp 98.4 F (36.9 C) (Oral)   Resp (!) 24   Ht 5\' 2"  (1.575 m)   Wt 140 lb 14 oz (63.9 kg)   SpO2 96%   BMI 25.77 kg/m   Intake/outpt: I/O last 3 completed shifts: In: 850 [P.O.:700; IV Piggyback:150] Out: 3300 [Urine:300; Other:3000]  Physical exam:  General - pleasant Eyes - pupils reactive ENT - no sinus tenderness, no oral exudate, no LAN Cardiac - regular, no murmur Chest - faint rhonchi Abd - soft, non tender Ext - no edema Skin - no rashes Neuro - normal strength Psych - normal mood  Labs: CBC Recent Labs    03/10/18 0555 03/11/18 0057  WBC 26.3* 18.5*  HGB 9.8* 8.7*  HCT 31.7* 27.8*  PLT 233 184    Coag's Recent Labs    03/10/18 1858  INR 1.11    BMET Recent Labs    03/10/18 0555 03/11/18 0058  NA 137 135  K 4.9 4.3  CL 102 97*  CO2 21* 28  BUN 74* 30*  CREATININE 4.05* 2.26*  GLUCOSE 180* 174*    Electrolytes Recent Labs    03/10/18 0555 03/11/18 0057 03/11/18 0058  CALCIUM 8.5*  --  7.8*  MG  --  1.6*  --   PHOS  --   --  2.7    Sepsis Markers Recent Labs    03/10/18 1115 03/11/18 0057 03/12/18 0532  PROCALCITON 0.25 0.44 0.78    ABG Recent Labs    03/10/18 0825  PHART 7.422  PCO2ART 34.2  PO2ART 84.0    Liver Enzymes Recent Labs    03/11/18 0058  ALBUMIN 2.2*    Cardiac Enzymes Recent Labs    03/10/18 1115 03/10/18 1858 03/11/18 0057  TROPONINI 0.42* 1.23* 1.01*    Glucose Recent Labs    03/10/18 1730 03/10/18 2140 03/11/18 0744 03/11/18 1132 03/11/18 2154 03/12/18 0734  GLUCAP 124* 189* 100* 188* 202*  103*    Imaging Dg Chest 2 View  Result Date: 03/11/2018 CLINICAL DATA:  Dyspnea EXAM: CHEST - 2 VIEW COMPARISON:  03/10/2018 FINDINGS: Cardiac shadow is stable but mildly enlarged. Pacing device is again seen and stable. Diffuse bilateral infiltrative changes are noted stable from the prior exam given some technical variation in the imaging. Small bilateral pleural effusions are again seen. No acute bony abnormality is noted. IMPRESSION: Overall stable appearance of the chest when compare with the previous day the when accounting for some differences in the technical factors of the imaging. Electronically Signed   By: Inez Catalina M.D.   On: 03/11/2018 11:17    Cultures: Blood 5/08 >>   Antibiotics: Rocephin 5/08 > 5/09 Cefepime 5/09 >  Assessment/plan:  Acute hypoxic respiratory failure. - oxygen to keep SpO2 90 to 95% - d/c Bipap  HCAP. - day 2 of cefepime  ESRD with pulmonary edema on CXR. - negative fluid balance as able  Hemoptysis. - from HCAP, pulmonary edema - much improved  COPD. - continue BDs  Updated pt's family at bedside.  PCCM will sign off.  Please call if  additional help needed.   Chesley Mires, MD [facility] Pulmonary/Critical Care 03/12/2018, 11:48 AM

## 2018-03-12 NOTE — Progress Notes (Signed)
[facility] Progress Note    Assessment/ Plan:   84 y/o F with PMH of ESRD on HD 2/2 HTN-  dialysis MWF in [place], COPD/asthma, hypertension, hyperlipidemia, CAD s/p stenting in August 2018, OA, GERD, paroxysmal A. Fib.   P/w shortness of breath that started last night and progressively worsened into today.  Leukocytosis to 26, and reporting a productive cough.  No fevers, chills, chest pain, abdominal pain.  CXR in ED with bilateral airspace opacities concerning for pneumonia.  Started on Vancomycin and cefepime and bcx obtained.  Nephrology consulted for dialysis.   1. ESRD on HD 2/2 HTN- receives dialysis MWF in [place].  SCr improving, AM labs pending.  Good UOP of 3.0 L in 24h.  HD repeated yesterday as she did not look comfortable.   2. HTN- well controlled   4. Hgb: 8.7, stable, continue to monitor  5. COPD/asthma 6. HLD 7. CAD  8. OA 9. GERD 10. paroxysmal A. Fib.   Subjective:   On 4L [place].  Pt denies SOB or discomfort.  No overnight events.     Objective:   BP 125/76 (BP Location: Right Arm)   Pulse 71   Temp 98.2 F (36.8 C) (Oral)   Resp 13   Ht 5\' 2"  (1.575 m)   Wt 140 lb 14 oz (63.9 kg)   SpO2 99%   BMI 25.77 kg/m   Intake/Output Summary (Last 24 hours) at 03/12/2018 1116 Last data filed at 03/12/2018 0900 Gross per 24 hour  Intake 460 ml  Output 3000 ml  Net -2540 ml   Weight change:   Physical Exam: General: 84 yo F, on 4L per [place]  Neuro: Alert, oriented x4, no focal deficits  HEENT: NCAT, EOMI, oropharynx clear, MMM Cardiovascular: RRR no MRG Lungs: Decreased breath sounds bilateral bases Abdomen:, NTND, positive bowel sounds Musculoskeletal: Normal range of motion x4 Skin:AV graft right antecubital fossa positive bruit and thrill   Imaging: Dg Chest 2 View  Result Date: 03/11/2018 CLINICAL DATA:  Dyspnea EXAM: CHEST - 2 VIEW COMPARISON:  03/10/2018 FINDINGS: Cardiac shadow is stable but mildly enlarged. Pacing device is  again seen and stable. Diffuse bilateral infiltrative changes are noted stable from the prior exam given some technical variation in the imaging. Small bilateral pleural effusions are again seen. No acute bony abnormality is noted. IMPRESSION: Overall stable appearance of the chest when compare with the previous day the when accounting for some differences in the technical factors of the imaging. Electronically Signed   By: Inez Catalina M.D.   On: 03/11/2018 11:17    Labs: BMET Recent Labs  Lab 03/10/18 0555 03/11/18 0058  NA 137 135  K 4.9 4.3  CL 102 97*  CO2 21* 28  GLUCOSE 180* 174*  BUN 74* 30*  CREATININE 4.05* 2.26*  CALCIUM 8.5* 7.8*  PHOS  --  2.7   CBC Recent Labs  Lab 03/10/18 0555 03/11/18 0057  WBC 26.3* 18.5*  NEUTROABS  --  15.0*  HGB 9.8* 8.7*  HCT 31.7* 27.8*  MCV 98.4 96.9  PLT 233 184    Medications:    . amiodarone  200 mg Oral Daily  . aspirin EC  81 mg Oral Daily  . atorvastatin  40 mg Oral q1800  . clopidogrel  75 mg Oral Daily  . fluconazole  200 mg Oral Q48H  . furosemide  40 mg Oral BID  . heparin  5,000 Units Subcutaneous Q8H  . ipratropium-albuterol  3 mL Nebulization  Q6H  . levothyroxine  50 mcg Oral QAC breakfast  . methylPREDNISolone (SOLU-MEDROL) injection  60 mg Intravenous Daily  . mometasone-formoterol  2 puff Inhalation BID  . nystatin  5 mL Oral QID  . pantoprazole  40 mg Oral Daily  . sodium chloride flush  3 mL Intravenous Q12H     Lovenia Kim, MD [place], PGY-2

## 2018-03-12 NOTE — Progress Notes (Signed)
Subjective:  Patient seen sitting comfortably in bedside chair this AM and states that she feels overall improved since admission, but not quite back to baseline. Continuing ongoing pain with eating and swallowing. NO other acute complaints.  Objective:  Vital signs in last 24 hours: Vitals:   03/12/18 0356 03/12/18 0737 03/12/18 1008 03/12/18 1134  BP:  125/76  (!) 143/46  Pulse:  71  71  Resp:  13  (!) 24  Temp:  98.2 F (36.8 C)  98.4 F (36.9 C)  TempSrc:  Oral  Oral  SpO2:  100% 99% 96%  Weight: 140 lb 14 oz (63.9 kg)     Height:       Physical Exam  Constitutional: She appears well-developed and well-nourished. No distress.  HENT:  Mouth/Throat: Oropharynx is clear and moist.  Small areas of white patches on tongue and posterior oropharynx, no ulcerations or erythema.  Cardiovascular: Normal rate, regular rhythm and intact distal pulses. Exam reveals no friction rub.  No murmur heard. Respiratory:  Patient stably saturating >92% on 2L [place] (home regimen) while in room. No accessory muscle use or nasal flaring. No cyanosis. Good air movement. No crackles or wheezing, overall improved from yesterday.  GI: Soft. Bowel sounds are normal. She exhibits no distension.  Musculoskeletal: She exhibits edema (trace -1+ pitting edema to mid shins bilaterally, improved from yesterday). She exhibits no tenderness (trace -1+ pitting edema to mid shins bilaterally).  Neurological: She is alert.  Face strength and sensation intact bilaterally. Tongue midline.Gross motor and sensation to light touch of upper and lower extremities intact bilaterally.   Skin: Skin is warm and dry. No rash noted. She is not diaphoretic. No erythema.  Psychiatric: She has a normal mood and affect. Her behavior is normal.   Assessment/Plan:  Principal Problem:   Pneumonia Active Problems:   ESRD (end stage renal disease) on dialysis (HCC)   CAD (coronary artery disease)   Atrial fibrillation (HCC)  Elevated troponin   Respiratory failure with hypoxia (HCC)   COPD (chronic obstructive pulmonary disease) (HCC)   Cough with hemoptysis   Pressure injury of skin   Acute and chronic respiratory failure with hypoxia (HCC)  Acute on Chronic Hypoxemic Respiratory Failure with hypoxia 2/2 pneumonia, volume overload: Cause of patient's hypoxia on admission multifactorial, but improving back to baseline with treatment of possible pneumonia with cefepime and volume removal with HD.  Will continue supportive care for breathing, including steroids, scheduled breathing treatments, and BiPAP PRN for increased work of breathing since this is improving patient's overall clinical course. -PCCM consulted and signed off -IV ceftriaxone for treatment of pneumonia, day 3 antibiotics overal -Scheduled DuoNebs, albuterol PRN, and solumedrol 60 mg daily x3 (will decrease to 40 mg x3 days, 20 mg x3 days) -Continue home Dulera for hx of COPD -Wean O2 to goal of 90-92% as tolerated  ESRD on HD MWF as of 07/2017: Patient still makes urine and is on Lasix BID.  -Lasix 40 mg BID  CAD s/p DES August 2018, A-Fib not on [facility]: Currently HR = 70s, no acute complaints of CP.   -Continue amiodarone 200mg  daily -Restart clopidogrel 75 mg daily -Continue atorvastatin 40mg , aspirin 81 mg daily  Oral thrush: Patient's PE stable and recently started on fluconazole, with interval improvement in eating soft foods yesterday. -Oral fluconazole 200 mg q48 hours for treatment of oral thrush, possible esophagitis (started on 5/9) -Magic mouthwash x4 hours PRN   Hypothyroidism: Chronic, stable. Continue home levothyroxine 50  mg daily.  FEN/GI: -Renal diet with 1.2L volume restriction -No IVF, replace electrolytes as needed -Home Protonix 40 mg daily  VTE Prophylaxis: Heparin TID Code Status: Partial - do not intubate  Dispo: Anticipated discharge in approximately 2-3 day(s).   Thomasene Ripple, MD 03/12/2018, 12:45  PM Pager: [phone]

## 2018-03-13 ENCOUNTER — Other Ambulatory Visit: Payer: Self-pay

## 2018-03-13 DIAGNOSIS — Z888 Allergy status to other drugs, medicaments and biological substances status: Secondary | ICD-10-CM

## 2018-03-13 DIAGNOSIS — Z885 Allergy status to narcotic agent status: Secondary | ICD-10-CM

## 2018-03-13 DIAGNOSIS — Z886 Allergy status to analgesic agent status: Secondary | ICD-10-CM

## 2018-03-13 DIAGNOSIS — Z91018 Allergy to other foods: Secondary | ICD-10-CM

## 2018-03-13 LAB — BASIC METABOLIC PANEL
Anion gap: 12 (ref 5–15)
BUN: 55 mg/dL — ABNORMAL HIGH (ref 6–20)
CHLORIDE: 97 mmol/L — AB (ref 101–111)
CO2: 25 mmol/L (ref 22–32)
CREATININE: 3.35 mg/dL — AB (ref 0.44–1.00)
Calcium: 8.3 mg/dL — ABNORMAL LOW (ref 8.9–10.3)
GFR calc Af Amer: 14 mL/min — ABNORMAL LOW (ref >60)
GFR calc non Af Amer: 12 mL/min — ABNORMAL LOW (ref >60)
Glucose, Bld: 128 mg/dL — ABNORMAL HIGH (ref 65–99)
POTASSIUM: 4.1 mmol/L (ref 3.5–5.1)
Sodium: 134 mmol/L — ABNORMAL LOW (ref 135–145)

## 2018-03-13 LAB — CBC
HEMATOCRIT: 26.9 % — AB (ref 36.0–46.0)
HEMOGLOBIN: 8.6 g/dL — AB (ref 12.0–15.0)
MCH: 30.8 pg (ref 26.0–34.0)
MCHC: 32 g/dL (ref 30.0–36.0)
MCV: 96.4 fL (ref 78.0–100.0)
Platelets: 201 10*3/uL (ref 150–400)
RBC: 2.79 MIL/uL — ABNORMAL LOW (ref 3.87–5.11)
RDW: 15.5 % (ref 11.5–15.5)
WBC: 16.2 10*3/uL — ABNORMAL HIGH (ref 4.0–10.5)

## 2018-03-13 LAB — GLUCOSE, CAPILLARY: GLUCOSE-CAPILLARY: 94 mg/dL (ref 65–99)

## 2018-03-13 LAB — MAGNESIUM: Magnesium: 1.9 mg/dL (ref 1.7–2.4)

## 2018-03-13 MED ORDER — PREDNISONE 10 MG (21) PO TBPK: ORAL_TABLET | ORAL | 0 refills | Status: AC

## 2018-03-13 MED ORDER — ALTEPLASE 2 MG IJ SOLR: 2.0000 mg | Freq: Once | INTRAMUSCULAR | Status: DC | PRN

## 2018-03-13 MED ORDER — HEPARIN SODIUM (PORCINE) 1000 UNIT/ML DIALYSIS: 1000.0000 [IU] | INTRAMUSCULAR | Status: DC | PRN

## 2018-03-13 MED ORDER — DARBEPOETIN ALFA 100 MCG/0.5ML IJ SOSY
100.0000 ug | PREFILLED_SYRINGE | Freq: Once | INTRAMUSCULAR | Status: DC
  Filled 2018-03-13: qty 0.5

## 2018-03-13 MED ORDER — LIDOCAINE-PRILOCAINE 2.5-2.5 % EX CREA: 1.0000 "application " | TOPICAL_CREAM | CUTANEOUS | Status: DC | PRN

## 2018-03-13 MED ORDER — FLUCONAZOLE 200 MG PO TABS: 200.0000 mg | ORAL_TABLET | ORAL | 0 refills | Status: AC

## 2018-03-13 MED ORDER — MAGIC MOUTHWASH: 5.0000 mL | Freq: Four times a day (QID) | ORAL | 0 refills | Status: AC | PRN

## 2018-03-13 MED ORDER — SODIUM CHLORIDE 0.9 % IV SOLN: 100.0000 mL | INTRAVENOUS | Status: DC | PRN

## 2018-03-13 MED ORDER — ACETAMINOPHEN 325 MG PO TABS
ORAL_TABLET | ORAL | Status: AC
  Filled 2018-03-13: qty 2

## 2018-03-13 MED ORDER — CEFDINIR 300 MG PO CAPS: 300.0000 mg | ORAL_CAPSULE | ORAL | 0 refills | Status: AC

## 2018-03-13 MED ORDER — LIDOCAINE HCL (PF) 1 % IJ SOLN: 5.0000 mL | INTRAMUSCULAR | Status: DC | PRN

## 2018-03-13 MED ORDER — PENTAFLUOROPROP-TETRAFLUOROETH EX AERO: 1.0000 "application " | INHALATION_SPRAY | CUTANEOUS | Status: DC | PRN

## 2018-03-13 MED ORDER — DARBEPOETIN ALFA 100 MCG/0.5ML IJ SOSY
100.0000 ug | PREFILLED_SYRINGE | Freq: Once | INTRAMUSCULAR | Status: AC
  Administered 2018-03-13: 100 ug via SUBCUTANEOUS
  Filled 2018-03-13: qty 0.5

## 2018-03-13 NOTE — Progress Notes (Signed)
  Date: 03/13/2018  Patient name: Stacie Roman  Medical record number: [id number]  Date of birth: [birth year]   I have seen and evaluated this patient and I have discussed the plan of care with the house staff. Please see their note for complete details. I concur with their findings with the following additions/corrections: Stacie Roman was seen in HD on HD. Cont to make improvements and pt feels breathing almost back to baseline. Still with mouth pain 2/2 thrush. Has completed 6 days of ABX as vanc started on the 6th. D/C on Po ABX, steroid taper, diflucan. On ASA and plavix after DES Aug 2018. Cards wanted dual anti-plt for 12 months. Cards note indicated she was on [facility] for her A Fib but no AC on her med list. Will need to discuss with her PCP. D/C home with home health  Bartholomew Crews, MD 03/13/2018, 1:53 PM

## 2018-03-13 NOTE — Progress Notes (Signed)
[facility] ROUNDING NOTE   Subjective:   Interval History: breathing much better this morning  She has dialysis scheduled and was seen on treatment  Brief history 55 year lady from [place]  Dialysis MWF  histrory of ESRD  HTN, COPD  CAD s/p stent Aug 2018  GERD  PPM  Paroxysmal Atrial Fibrillation. She was admitted with pulmonary edema and pneumonia/bronchitis     Objective:  Vital signs in last 24 hours:  Temp:  [97.1 F (36.2 C)-98.3 F (36.8 C)] 97.5 F (36.4 C) (05/11 1124) Pulse Rate:  [67-77] 69 (05/11 1124) Resp:  [18-24] 22 (05/11 1124) BP: (114-167)/(43-66) 114/43 (05/11 1124) SpO2:  [94 %-97 %] 96 % (05/11 1124) Weight:  [140 lb 3.4 oz (63.6 kg)-144 lb 6.4 oz (65.5 kg)] 140 lb 3.4 oz (63.6 kg) (05/11 1124)  Weight change:  Filed Weights   03/12/18 0356 03/13/18 0720 03/13/18 1124  Weight: 140 lb 14 oz (63.9 kg) 144 lb 6.4 oz (65.5 kg) 140 lb 3.4 oz (63.6 kg)    Intake/Output: I/O last 3 completed shifts: In: 11 [P.O.:460] Out: -    Intake/Output this shift:  Total I/O In: -  Out: 2000 [Other:2000]  Alert non distressed CVS- RRR JVP not elevated RS- some diminished at bases  ABD- BS present soft non-distended EXT-  Trace lower extremity  edema   Basic Metabolic Panel: Recent Labs  Lab 03/10/18 0555 03/11/18 0057 03/11/18 0058 03/13/18 0451  NA 137  --  135 134*  K 4.9  --  4.3 4.1  CL 102  --  97* 97*  CO2 21*  --  28 25  GLUCOSE 180*  --  174* 128*  BUN 74*  --  30* 55*  CREATININE 4.05*  --  2.26* 3.35*  CALCIUM 8.5*  --  7.8* 8.3*  MG  --  1.6*  --  1.9  PHOS  --   --  2.7  --     Liver Function Tests: Recent Labs  Lab 03/11/18 0058  ALBUMIN 2.2*   No results for input(s): LIPASE, AMYLASE in the last 168 hours. No results for input(s): AMMONIA in the last 168 hours.  CBC: Recent Labs  Lab 03/10/18 0555 03/11/18 0057 03/13/18 0451  WBC 26.3* 18.5* 16.2*  NEUTROABS  --  15.0*  --   HGB 9.8* 8.7* 8.6*  HCT  31.7* 27.8* 26.9*  MCV 98.4 96.9 96.4  PLT 233 184 201    Cardiac Enzymes: Recent Labs  Lab 03/10/18 1115 03/10/18 1858 03/11/18 0057  TROPONINI 0.42* 1.23* 1.01*    BNP: Invalid input(s): POCBNP  CBG: Recent Labs  Lab 03/11/18 2154 03/12/18 0734 03/12/18 1131 03/12/18 1646 03/12/18 2126  GLUCAP 202* 103* 163* 216* 159*    Microbiology: Results for orders placed or performed during the hospital encounter of 03/10/18  Blood culture (routine x 2)     Status: None (Preliminary result)   Collection Time: 03/10/18  8:35 AM  Result Value Ref Range Status   Specimen Description BLOOD LEFT ANTECUBITAL  Final   Special Requests   Final    BOTTLES DRAWN AEROBIC AND ANAEROBIC Blood Culture adequate volume   Culture   Final    NO GROWTH 3 DAYS Performed at [facility], [place] [address] [place], [place] [address]    Report Status PENDING  Incomplete  Blood culture (routine x 2)     Status: None (Preliminary result)   Collection Time: 03/10/18  8:36 AM  Result Value Ref Range Status   Specimen Description BLOOD LEFT HAND  Final   Special Requests   Final    BOTTLES DRAWN AEROBIC ONLY Blood Culture adequate volume   Culture   Final    NO GROWTH 3 DAYS Performed at [facility], [address] [place], [place] [address]    Report Status PENDING  Incomplete  MRSA PCR Screening     Status: None   Collection Time: 03/10/18  6:06 PM  Result Value Ref Range Status   MRSA by PCR NEGATIVE NEGATIVE Final    Comment:        The GeneXpert MRSA Assay (FDA approved for NASAL specimens only), is one component of a comprehensive MRSA colonization surveillance program. It is not intended to diagnose MRSA infection nor to guide or monitor treatment for MRSA infections. Performed at [facility], [place] [address] [place], [place] [address]     Coagulation Studies: Recent Labs    03/10/18 1858  LABPROT 14.2  INR 1.11    Urinalysis: No results for  input(s): COLORURINE, LABSPEC, PHURINE, GLUCOSEU, HGBUR, BILIRUBINUR, KETONESUR, PROTEINUR, UROBILINOGEN, NITRITE, LEUKOCYTESUR in the last 72 hours.  Invalid input(s): APPERANCEUR    Imaging: No results found.   Medications:   . sodium chloride    . sodium chloride    . ceFEPime (MAXIPIME) IV Stopped (03/12/18 1035)   . amiodarone  200 mg Oral Daily  . aspirin EC  81 mg Oral Daily  . atorvastatin  40 mg Oral q1800  . clopidogrel  75 mg Oral Daily  . fluconazole  200 mg Oral Q48H  . furosemide  40 mg Oral BID  . heparin  5,000 Units Subcutaneous Q8H  . ipratropium-albuterol  3 mL Nebulization Q6H  . levothyroxine  50 mcg Oral QAC breakfast  . methylPREDNISolone (SOLU-MEDROL) injection  60 mg Intravenous Daily  . mometasone-formoterol  2 puff Inhalation BID  . nystatin  5 mL Oral QID  . pantoprazole  40 mg Oral Daily  . sodium chloride flush  3 mL Intravenous Q12H   sodium chloride, sodium chloride, acetaminophen **OR** acetaminophen, albuterol, alteplase, heparin, lidocaine (PF), lidocaine-prilocaine, magic mouthwash, pentafluoroprop-tetrafluoroeth, polyethylene glycol  Assessment/ Plan:    End stage renal disease   She is dialyzing on Saturday as she is off schedule  Usually MWF  She can be discharged after dialysis from a renal perspective and suggest that she goes Monday for regular dialysis   Hypoxic respiratory failure  She can continue steroids and antibiotics He wbc is decreasing    Oral thrush  Continues on mouth washs  CAD s/p DES  Continues on amiodarone and plavix  Anemia  She has had no ESA  And we could dose her with aranesp prior to discharge     LOS: 3 Arwyn Besaw W @TODAY @12 :11 PM

## 2018-03-13 NOTE — Progress Notes (Signed)
   Subjective: Patient was getting dialysis when seen this morning.  According to patient she is back to her baseline now except her mouth thrush.  She is still feeling quite pain especially with eating and unable to eat much.  We discussed with her that PT/OT is recommending SNF placement which she declined, stating that she will rather go home with home health and her son also with her health at home.  Objective:  Vital signs in last 24 hours: Vitals:   03/12/18 1648 03/12/18 2024 03/12/18 2050 03/13/18 0145  BP: (!) 141/52 (!) 152/56    Pulse: 70 71    Resp: (!) 24     Temp: 98.3 F (36.8 C) 98.1 F (36.7 C)    TempSrc: Oral Oral    SpO2: 97% 96% 97% 94%  Weight:      Height:       General.  Well-developed, pleasant lady, lying comfortably in dialysis bed, in no acute distress. Mouth/throat.  Multiple small areas of white plaques on tongue.  No ulceration or erythema. Lungs.  Saturating well on 2 L.  Clear bilaterally with good air movements. CV.  Regular rate and rhythm. Abdomen.  Soft, nontender, bowel sounds positive. Extremities.  Trace lower extremity edema.  Assessment/Plan:  Acute on Chronic Hypoxemic Respiratory Failure with hypoxia 2/2 pneumonia, volume overload.  Patient was getting an extra dialysis today, appears euvolemic.  She is not requiring BiPAP anymore. She is stable enough to be discharged. -She will continue cefdinir for 2 more days. -We will start steroid taper from tomorrow. -She will continue using her home oxygen. -We will order home health as patient is not willing to go to a skilled nursing facility.  ESRD on HD MWF as of 07/2017: Patient still makes urine and is on Lasix BID.  -Lasix 40 mg BID  CAD s/p DES August 2018, A-Fib not on [facility]: Currently HR = 70s, no acute complaints of CP.   -Continue amiodarone 200mg  daily -Restart clopidogrel 75 mg daily -Continue atorvastatin 40mg , aspirin 81 mg daily  Oral thrush: Patient's PE stable and  recently started on fluconazole.  -Oral fluconazole 200 mg q48 hours for treatment of oral thrush, possible esophagitis (started on 5/9)-continue for 7 more days.  On dialysis days she should take her medicine after dialysis. -Magic mouthwash x4 hours PRN   Hypothyroidism: Chronic, stable. Continue home levothyroxine 50 mg daily.  Dispo: Anticipated discharge in approximately 0 day(s).   Lorella Nimrod, MD 03/13/2018, 6:54 AM Pager: [phone]

## 2018-03-13 NOTE — Care Management Note (Signed)
Case Management Note  Patient Details  Name: Stacie Roman MRN: [id number] Date of Birth: [birth year]  Subjective/Objective: Pt presented for SOB- treating for Pneumonia. PTA from home with son and active with [facility]. PT/OT recommendations for SNF- Pt has declined SNF at this time and wants to transition home.                   Action/Plan: CM did speak with son and per son active with [facility] and wants to continue with plan of care. CM did call Jermiane with [facility] for Services. SOC to begin within 24-48 hours post transition home. No further needs from CM at this time.   Expected Discharge Date:  03/13/18               Expected Discharge Plan:  [place]  In-House Referral:  Clinical Social Work  Discharge planning Services  CM Consult  Post Acute Care Choice:  Home Health Choice offered to:  Patient, Adult Children  DME Arranged:  N/A DME Agency:  NA  HH Arranged:  RN, Disease Management, PT, OT, Nurse's Aide, Refused SNF [place] Agency:  [place]  Status of Service:  Completed, signed off  If discussed at [place] of Stay Meetings, dates discussed:    Additional Comments:  Bethena Roys, RN 03/13/2018, 2:25 PM

## 2018-03-13 NOTE — Evaluation (Signed)
Occupational Therapy Evaluation Patient Details Name: Stacie Roman MRN: [id number] DOB: Dec 03, 1930 Today's Date: 03/13/2018    History of Present Illness 84 yo female with onset of recent PNA and now elevated troponin of 1.01, with respiratory failure/hypoxia and hemoptysis. PMHx:  HTN, CAD, stents, ESRD, PAF, CHF, COPD, stroke, asthma, pacemaker   Clinical Impression   PTA, pt was living with her son who assisted with ADLs and IADLs. Pt also has personal care aide who assist with BADLs. Pt currently requiring Mod A for UB ADLs, Max A for LB ADLs, and Min A for functional mobility with right plateform walker. Pt presenting with poor endurance and activity tolerance as seen by quick fatigue and dropping in SpO2 to low 80s during activity. Discussed recommendation for SNF to receive further OT to optimize safety and independence before transitioning home. Pt reports she plans to dc home with son. Pt would benefit from further acute OT to facilitate safe dc and increase activity tolerance during ADLs. Will continue to follow acutely as admitted.    Follow Up Recommendations  SNF    Equipment Recommendations  None recommended by OT    Recommendations for Other Services PT consult     Precautions / Restrictions Precautions Precautions: Fall Restrictions Weight Bearing Restrictions: No      Mobility Bed Mobility Overal bed mobility: Needs Assistance Bed Mobility: Supine to Sit;Sit to Supine     Supine to sit: Mod assist;HOB elevated Sit to supine: Mod assist;+2 for physical assistance;HOB elevated   General bed mobility comments: Pt requiring Mod A to elevatr trunk and bring hips to EOB using bed pad. Pt requiring Mod A +2 to return to bed. Required assisting elevating BLEs and facilitate lowering of trunk. Rolling left and right for placement of pressure reflief pad.   Transfers Overall transfer level: Needs assistance Equipment used: Right platform walker Transfers: Sit  to/from Stand Sit to Stand: Min assist         General transfer comment: Min A for management of RUE to plateform. Min A to power up into standing.     Balance Overall balance assessment: Needs assistance Sitting-balance support: Feet supported Sitting balance-Leahy Scale: Fair     Standing balance support: Bilateral upper extremity supported;During functional activity Standing balance-Leahy Scale: Poor                             ADL either performed or assessed with clinical judgement   ADL Overall ADL's : Needs assistance/impaired Eating/Feeding: Set up;Sitting   Grooming: Brushing hair;Set up;Sitting;Supervision/safety   Upper Body Bathing: Moderate assistance;Sitting   Lower Body Bathing: Maximal assistance;Sit to/from stand   Upper Body Dressing : Moderate assistance;Sitting   Lower Body Dressing: Maximal assistance;Sit to/from stand   Toilet Transfer: Minimal assistance;Ambulation;RW           Functional mobility during ADLs: Minimal assistance;Rolling walker(plateform walker) General ADL Comments: Pt with decreased funtional performance and is limtied by poor endurance, functional use of RUE, activity tolerance, and strength. Discussed SNF placement, but pt reporting she wants to go home and has everything she needs at home     Vision Baseline Vision/History: Wears glasses Wears Glasses: At all times Patient Visual Report: No change from baseline       Perception     Praxis      Pertinent Vitals/Pain Pain Assessment: No/denies pain     Hand Dominance Right   Extremity/Trunk Assessment Upper Extremity Assessment Upper  Extremity Assessment: RUE deficits/detail RUE Deficits / Details: Limited ROM at fingers and wrist. Pain during movements and with touch. Weakness for elbow and shoulder movement RUE Coordination: decreased fine motor   Lower Extremity Assessment Lower Extremity Assessment: Defer to PT evaluation   Cervical / Trunk  Assessment Cervical / Trunk Assessment: Kyphotic   Communication Communication Communication: HOH   Cognition Arousal/Alertness: Awake/alert Behavior During Therapy: WFL for tasks assessed/performed Overall Cognitive Status: Within Functional Limits for tasks assessed                                     General Comments  SpO2 dropping to 82% on 2L O2 during activity. VCs for pruse lip breathing and pt requiring significant amounto f time to return to low 90s. raising O2 to 3L to facilitate return to 90% after activity. Pt stating "I just can't catch my breath."    Exercises     Shoulder Instructions      Home Living Family/patient expects to be discharged to:: Private residence Living Arrangements: Children(Son) Available Help at Discharge: Family;Available 24 hours/day Type of Home: House Home Access: Stairs to enter     Home Layout: Multi-level;Other (Comment)(has stair lifts for changing floors)     Bathroom Shower/Tub: Hospital doctor Toilet: Handicapped height     Home Equipment: Environmental consultant - 2 wheels;Cane - single point;Walker - 4 wheels   Additional Comments: son has been caring for pt for a longer time      Prior Functioning/Environment Level of Independence: Needs assistance  Gait / Transfers Assistance Needed: min assist from son with platform attachment on R arm ADL's / Homemaking Assistance Needed: CNA and son assist with ADLs. Son takes care of housework   Comments: Pt with HHOT, HHPT, RN, and aide        OT Problem List: Decreased strength;Decreased range of motion;Decreased activity tolerance;Impaired balance (sitting and/or standing);Decreased knowledge of precautions;Cardiopulmonary status limiting activity;Pain;Impaired UE functional use;Obesity      OT Treatment/Interventions: Self-care/ADL training;Therapeutic exercise;Energy conservation;DME and/or AE instruction;Therapeutic activities;Patient/family education    OT  Goals(Current goals can be found in the care plan section) Acute Rehab OT Goals Patient Stated Goal: Go home today OT Goal Formulation: With patient Time For Goal Achievement: 03/27/18 Potential to Achieve Goals: Good ADL Goals Pt Will Perform Grooming: with min guard assist;standing Pt Will Perform Upper Body Dressing: with min guard assist;sitting Pt Will Perform Lower Body Dressing: with min assist;sit to/from stand Pt Will Transfer to Toilet: with min guard assist;ambulating;bedside commode Pt Will Perform Toileting - Clothing Manipulation and hygiene: with min guard assist;sit to/from stand  OT Frequency: Min 2X/week   Barriers to D/C:            Co-evaluation              AM-PAC PT "6 Clicks" Daily Activity     Outcome Measure Help from another person eating meals?: A Little Help from another person taking care of personal grooming?: A Little Help from another person toileting, which includes using toliet, bedpan, or urinal?: A Little Help from another person bathing (including washing, rinsing, drying)?: A Lot Help from another person to put on and taking off regular upper body clothing?: A Lot Help from another person to put on and taking off regular lower body clothing?: A Lot 6 Click Score: 15   End of Session Equipment Utilized During Treatment: Gait  belt;Rolling walker;Oxygen Nurse Communication: Mobility status  Activity Tolerance: Patient limited by fatigue Patient left: in bed;with call bell/phone within reach;with nursing/sitter in room  OT Visit Diagnosis: Unsteadiness on feet (R26.81);Other abnormalities of gait and mobility (R26.89);Muscle weakness (generalized) (M62.81)                Time: 8251-8984 OT Time Calculation (min): 28 min Charges:  OT General Charges $OT Visit: 1 Visit OT Evaluation $OT Eval Moderate Complexity: 1 Mod OT Treatments $Self Care/Home Management : 8-22 mins G-Codes:     Kiante Petrovich MSOT, OTR/L Acute Rehab Pager:  [phone] Office: [place] 03/13/2018, 1:22 PM

## 2018-03-13 NOTE — Discharge Summary (Signed)
Name: Stacie Roman MRN: [id number] DOB: Dec 10, 1930 84 y.o. PCP: [name], MD  Date of Admission: 03/10/2018  5:49 AM Date of Discharge: 03/13/2018 Attending Physician: Larey Dresser, MD  Discharge Diagnosis: Acute on chronic respiratory failure with hypoxia, secondary to pneumonia and volume overload in ESRD patient on HD  Principal Problem:   Pneumonia Active Problems:   ESRD (end stage renal disease) on dialysis [facility])   CAD (coronary artery disease)   Atrial fibrillation ([place])   Elevated troponin   Respiratory failure with hypoxia (HCC)   COPD (chronic obstructive pulmonary disease) (HCC)   Cough with hemoptysis   Pressure injury of skin   Acute and chronic respiratory failure with hypoxia [facility])  Discharge Medications: Allergies as of 03/13/2018      Reactions   Hydralazine Other (See Comments), Shortness Of Breath   Dizzy, lightheaded Knocks her out   Mushroom Extract Complex Anaphylaxis, Shortness Of Breath, Swelling   Throat swelling   Aspirin Other (See Comments)   Bleeding disorder   Percocet [oxycodone-acetaminophen] Itching   Morphine And Related Nausea And Vomiting      Medication List    STOP taking these medications   nystatin 100000 UNIT/ML suspension Commonly known as:  MYCOSTATIN   predniSONE 20 MG tablet Commonly known as:  DELTASONE Replaced by:  predniSONE 10 MG (21) Tbpk tablet     TAKE these medications   albuterol 108 (90 Base) MCG/ACT inhaler Commonly known as:  PROVENTIL HFA;VENTOLIN HFA Inhale 1-2 puffs into the lungs every 6 (six) hours as needed for wheezing or shortness of breath.   albuterol (2.5 MG/3ML) 0.083% nebulizer solution Commonly known as:  PROVENTIL Take 2.5 mg by nebulization every 6 (six) hours as needed for wheezing or shortness of breath.   amiodarone 200 MG tablet Commonly known as:  PACERONE Take 200 mg by mouth daily.   aspirin EC 81 MG tablet Take 81 mg by mouth daily.   atorvastatin 40 MG  tablet Commonly known as:  LIPITOR Take 40 mg by mouth daily.   bisacodyl 5 MG EC tablet Commonly known as:  DULCOLAX Take 10 mg by mouth 2 (two) times daily.   cefdinir 300 MG capsule Commonly known as:  OMNICEF Take 1 capsule (300 mg total) by mouth every other day for 3 days. Take an additional capsule after dialysis.   clopidogrel 75 MG tablet Commonly known as:  PLAVIX Take 75 mg by mouth daily.   docusate sodium 100 MG capsule Commonly known as:  COLACE Take 200 mg by mouth 2 (two) times daily.   fluconazole 200 MG tablet Commonly known as:  DIFLUCAN Take 1 tablet (200 mg total) by mouth every other day for 7 days. On dialysis day, take your pill after dialysis.   furosemide 40 MG tablet Commonly known as:  LASIX Take 40 mg by mouth 2 (two) times daily.   gabapentin 100 MG capsule Commonly known as:  NEURONTIN Take 1 capsule (100 mg total) by mouth 2 (two) times daily.   INCRUSE ELLIPTA 62.5 MCG/INH Aepb Generic drug:  umeclidinium bromide Inhale 1 puff into the lungs daily.   levothyroxine 50 MCG tablet Commonly known as:  SYNTHROID, LEVOTHROID Take 50 mcg by mouth daily before breakfast.   loratadine 10 MG tablet Commonly known as:  CLARITIN Take 10 mg by mouth daily.   magic mouthwash Soln Take 5 mLs by mouth 4 (four) times daily as needed for mouth pain.   metoprolol succinate 25 MG 24 hr  tablet Commonly known as:  TOPROL-XL Take 25 mg by mouth daily.   mometasone-formoterol 200-5 MCG/ACT Aero Commonly known as:  DULERA Inhale 2 puffs into the lungs 2 (two) times daily.   omeprazole 20 MG capsule Commonly known as:  PRILOSEC Take 20 mg by mouth daily.   oxybutynin 10 MG 24 hr tablet Commonly known as:  DITROPAN-XL Take 10 mg by mouth daily at 6 PM.   OXYGEN Inhale 2 L into the lungs continuous. 2 L through the night as needed during the day   predniSONE 10 MG (21) Tbpk tablet Commonly known as:  STERAPRED UNI-PAK 21 TAB Take 6 tablets (60  mg total) by mouth daily for 1 day, THEN 5 tablets (50 mg total) daily for 1 day, THEN 4 tablets (40 mg total) daily for 1 day, THEN 3 tablets (30 mg total) daily for 1 day, THEN 2 tablets (20 mg total) daily for 1 day, THEN 1 tablet (10 mg total) daily for 1 day. Then 50 mg for next day, continue decreasing 1 pill each day, or next day 40 mg followed by 30, 20 and 10 and then stop taking prednisone.. Start taking on:  03/14/2018 Replaces:  predniSONE 20 MG tablet   vitamin B-12 1000 MCG tablet Commonly known as:  CYANOCOBALAMIN Take 1,000 mcg by mouth daily.   Vitamin D3 5000 units Caps Take 5,000 Units by mouth daily.      Disposition and follow-up:   Ms.Stacie Roman was discharged from [facility] in Stable condition.  At the hospital follow up visit please address:  1.  Patient arrived with acute on chronic respiratory failure with hypoxia 2/2 pneumonia and volume overload. She was treated with supportive care of her breathing (BiPAP PRN, steroids, breathing treatments), IV antibiotics, and serial HD. At time of discharge work of breathing was within normal limits on home O2, 2L [place]. She was discharged with oral antibiotics and steroid taper. At hospital follow up please assess breathing and resolution of productive cough.  Patient also endorsing significant mouth pain and dysphagia 2/2 recently diagnosis oral candidiasis. Patient started on fluconazole (q48 hours through 03/19/18) for her symptoms. Please assess for resolution of this infection and clinical improvement in dysphagia.  2.  Labs / imaging needed at time of follow-up: None  3.  Pending labs/ test needing follow-up: None  Follow-up Appointments: Follow-up Information    [name], MD.   Specialty:  Family Medicine Contact information: [address] [place] [address] [phone]        [facility] [place] Follow up.   Specialty:  Home Health Services Why:   Registered Nurse, Physical/Occupational Therapy, Aide.  Contact information: [place] [address] [phone]           Hospital Course by problem list: Principal Problem:   Pneumonia Active Problems:   ESRD (end stage renal disease) on dialysis [facility])   CAD (coronary artery disease)   Atrial fibrillation (HCC)   Elevated troponin   Respiratory failure with hypoxia (HCC)   COPD (chronic obstructive pulmonary disease) (HCC)   Cough with hemoptysis   Pressure injury of skin   Acute and chronic respiratory failure with hypoxia (HCC)   Acute on Chronic Hypoxemic Respiratory Failure with hypoxia 2/2 pneumonia, volume overload: The patient initially presented reports of hemoptysis, shortness of breath, and hypoxia on home oxygen level (2L). The cause of patient's hypoxia on admission was thought to be a result of pneumonia  and volume overload. The patient initially required BiPAP to maintain oxygenation remained >96%, however she was weaned to 3L nasal cannula after undergoing dialysis overnight on HD #1. The patient continued to require 3L of [place] after dialysis. While she was afebrile on admission, her chest X ray which was concerning for bilateral pneumonia and procalcitonin levels were elevated >0.25, suggesting patient would benefit from antibiotic therapy. Given this, the patient was given vancomycin and cefepmine in ED but narrowed to cefepime on HD #1. She was continued on IV steroids and given scheduled breathing treatments to further support her breathing. The patient continued on PRN BiPAP for increased work of breathing, but successfully weaned to home 2L O2 via [place]. In the setting of hemoptysis the patient's home plavix was held early on admission. Her hemoptysis was thought to be due to underlying airway irritation in the setting of acute pneumonia. This stopped on HD #1 and her home plavix was restarted without adverse events. The patient was discharged to home on  HD #3, when her blood cultures obtained on admission were no growth @ 72 hours and she was breathing comfortably on home O2 for about 24 hours. Patient was given additional 3 doses of cefediner to take as an outpatient (total 1 week of treatment), 1 week steroid taper, and orders for home health PT/OT. Please assess for signs/symptoms of infection, improvement in productive cough at hospital follow up.   ESRD on HD MWF as of 07/2017:Patient presented with clinical volume overload and underwent HD after arriving to the ED with significant improvement in her symptoms. The patient had ongoing HD while inpatient and was maintained on outpatient regimen of Lasix  40 mg BID, as patient continues to make urine while on HD, for management of fluid status. Patient discharged in euvolemic state at weight of 140 lbs (64 kg).   CAD s/p DES August 2018,A-Fib not on anticoagulation: Patient's troponin elevated upon arrival to the ED and peaked around 1.3 overnight (downtrended to 1.0). Her EKG was without acute changes to suggest ischemia and was without chest pain, suggesting that troponin elevation 2/2 demand ischemia given above and underlying CAD. Patient had DES placed in August and plavix held for 24 hours during admission 2/2 hemoptysis (as stated above). Patient was discharged without changes to home aspirin and plavix regimen. Patient's A-Fib was controlled with amiodarone while inpatient and no episodes of RVR were noted on telemetry while on the floor. Patient not on anticoagulation as outpatient - defer further management and induction of additional anticoagulation to PCP and cardiologist.   Oral thrush: Patient arrived with white plaques on tongue and posterior oropharynx, which were without ulceration and present for some time prior to admission per patient report. Patient on nystatin and magic mouthwash rinse as an outpatient but expressed dysphagia and difficulty completing this regimen 2/2 worsening  mouth pain upon arrival to the floor. She was started on oral fluconazole 200 mg q48 hours for treatment of oral thrush and possible esophagitis given her complaints of dysphagia (treatment from 03/11/18 - 03/19/18). Prior to discharge she was instructed to take this medicine after dialysis on dialysis days and continue concurrent use of magic mouthwash and nystatin rinse.   Discharge Vitals:   BP (!) 145/56 (BP Location: Left Arm)   Pulse 60   Temp 97.7 F (36.5 C)   Resp (!) 22   Ht 5\' 2"  (1.575 m)   Wt 140 lb 3.4 oz (63.6 kg)   SpO2 96%   BMI  25.65 kg/m   Pertinent Labs, Studies, and Procedures:   BMP Latest Ref Rng & Units 03/13/2018 03/11/2018 03/10/2018  Glucose 65 - 99 mg/dL 128(H) 174(H) 180(H)  BUN 6 - 20 mg/dL 55(H) 30(H) 74(H)  Creatinine 0.44 - 1.00 mg/dL 3.35(H) 2.26(H) 4.05(H)  Sodium 135 - 145 mmol/L 134(L) 135 137  Potassium 3.5 - 5.1 mmol/L 4.1 4.3 4.9  Chloride 101 - 111 mmol/L 97(L) 97(L) 102  CO2 22 - 32 mmol/L 25 28 21(L)  Calcium 8.9 - 10.3 mg/dL 8.3(L) 7.8(L) 8.5(L)   CBC Latest Ref Rng & Units 03/13/2018 03/11/2018 03/10/2018  WBC 4.0 - 10.5 K/uL 16.2(H) 18.5(H) 26.3(H)  Hemoglobin 12.0 - 15.0 g/dL 8.6(L) 8.7(L) 9.8(L)  Hematocrit 36.0 - 46.0 % 26.9(L) 27.8(L) 31.7(L)  Platelets 150 - 400 K/uL 201 184 233   ABG    Component Value Date/Time   PHART 7.422 03/10/2018 0825   PCO2ART 34.2 03/10/2018 0825   PO2ART 84.0 03/10/2018 0825   HCO3 22.3 03/10/2018 0825   TCO2 23 03/10/2018 0825   ACIDBASEDEF 2.0 03/10/2018 0825   O2SAT 97.0 03/10/2018 0825   Blood culture, 03/10/2018 x2 = No growth at 72 hours Procalcitonin = 0.44, 0.78  Troponin q6 hours x3= 0.42, 1.23, 1.01  Chest X ray, 03/10/2017 1 view FINDINGS: The lungs are hyperinflated. The interstitial markings remain increased but have improved slightly. Confluent airspace opacities persist bilaterally. There is a small left pleural effusion. The heart is top-normal in size. The pulmonary vascularity is  engorged and indistinct. There is calcification in the wall of the thoracic aorta. The ICD is in stable position.  IMPRESSION: Slight interval improvement in the appearance of the pulmonary interstitium. Bilateral airspace opacities compatible with pneumonia or asymmetric pulmonary edema. Small left pleural effusion, stable. Stable cardiomegaly.  Chest X ray, 03/11/2017 2 views IMPRESSION: Overall stable appearance of the chest when compare with the previous day the when accounting for some differences in the technical factors of the imaging.  Discharge Instructions: Discharge Instructions    Diet - low sodium heart healthy   Complete by:  As directed    Discharge instructions   Complete by:  As directed    It was pleasure taking care of you. I am giving you a tapering dose of prednisone-take it as directed. We are also ordering home health care for you as you do not want to go to a skilled nursing facility. I am giving you an antibiotic to be taken for 3 more days. Please follow-up with your primary care within a week. Continue dialysis according to your schedule. Continue using oxygen all the time.   Increase activity slowly   Complete by:  As directed       Signed: Thomasene Ripple, MD 03/14/2018, 9:51 AM   Pager: [phone]

## 2018-03-13 NOTE — Progress Notes (Signed)
OT Cancellation Note  Patient Details Name: Stacie Roman MRN: [id number] DOB: Mar 08, 1931   Cancelled Treatment:    Reason Eval/Treat Not Completed: Patient at procedure or test/ unavailable(Off the floor for procedure. WIll return as schedule allows. Thank you!)  [place], OTR/L Acute Rehab Pager: [phone] Office: [phone] 03/13/2018, 10:38 AM

## 2018-03-15 LAB — CULTURE, BLOOD (ROUTINE X 2)
Culture: NO GROWTH
Culture: NO GROWTH
Special Requests: ADEQUATE
Special Requests: ADEQUATE

## 2018-09-07 ENCOUNTER — Other Ambulatory Visit: Payer: Self-pay | Admitting: Neurosurgery

## 2018-09-07 DIAGNOSIS — G450 Vertebro-basilar artery syndrome: Secondary | ICD-10-CM

## 2018-09-14 ENCOUNTER — Ambulatory Visit
Admission: RE | Admit: 2018-09-14 | Discharge: 2018-09-14 | Disposition: A | Discharge status: Home / Self Care | Payer: Medicare HMO | Source: Ambulatory Visit | Attending: Neurosurgery | Admitting: Neurosurgery

## 2018-09-14 DIAGNOSIS — G450 Vertebro-basilar artery syndrome: Secondary | ICD-10-CM

## 2018-09-14 MED ORDER — IOPAMIDOL (ISOVUE-370) INJECTION 76%
75.0000 mL | Freq: Once | INTRAVENOUS | Status: AC | PRN
  Administered 2018-09-14: 75 mL via INTRAVENOUS

## 2020-08-22 IMAGING — CT CT ANGIO NECK
3 of 7 series · 10 of 35 positions shown · IV contrast (iopamidol)
Comparison: None.

CLINICAL DATA: Three episodes of syncope that occur after
hemodialysis. Evaluate for vertebrobasilar insufficiency

EXAM:
CT ANGIOGRAPHY NECK
TECHNIQUE: Multidetector CT imaging of the neck was performed using the
standard protocol during bolus administration of intravenous
contrast. Multiplanar CT image reconstructions and MIPs were
obtained to evaluate the vascular anatomy. Carotid stenosis
measurements (when applicable) are obtained utilizing NASCET
criteria, using the distal internal carotid diameter as the
denominator.
CONTRAST:  75mL 1BQUZ2-L0I IOPAMIDOL (1BQUZ2-L0I) INJECTION 76%

[Series 10: cta neck 2.00 bv36 s3 cta neck (person_name) · axial · 0.54mm/px · z∈[-805,-723]mm · 2 of 125 slices shown]
[im 42/125  soft-tissue]
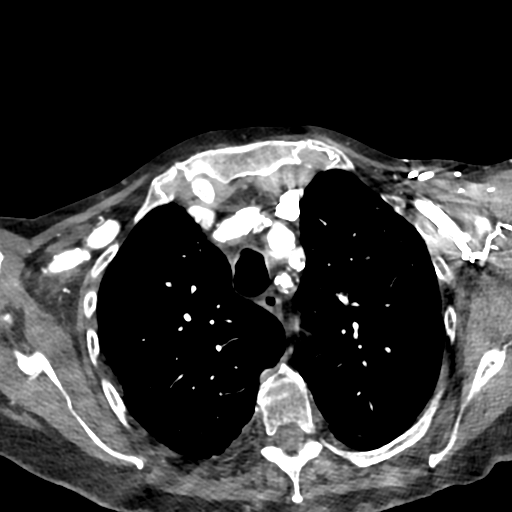
[im 83/125  soft-tissue]
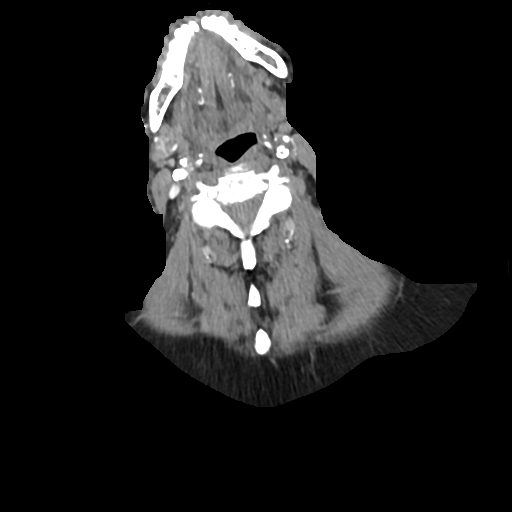

[Series 11: cta neck 1.00 bv48 s3 ax ax thin mips · axial · 0.51mm/px · z∈[-853,-689]mm · 5 of 248 slices shown]
[im 42/248  soft-tissue]
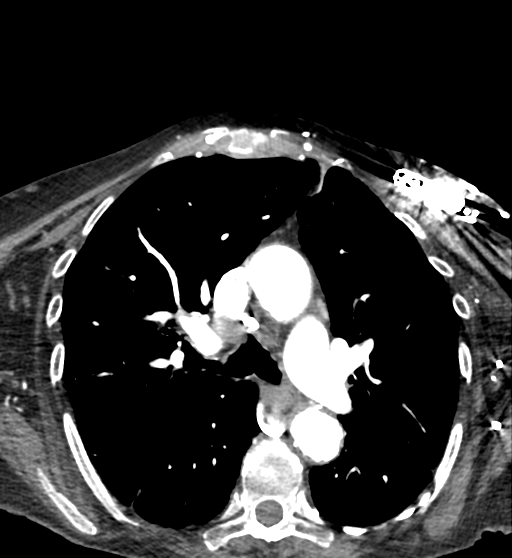
[im 83/248  bone]
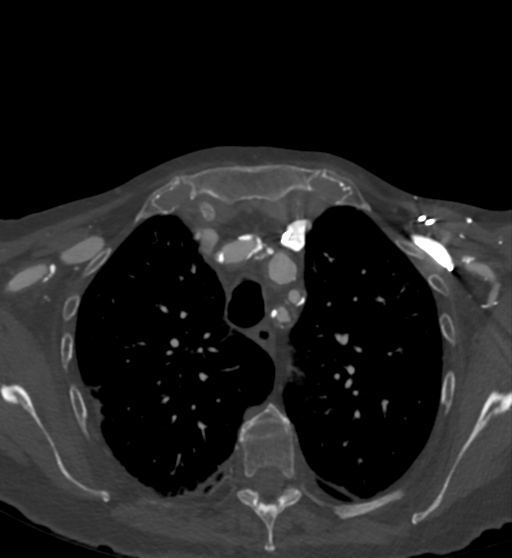
[im 124/248  soft-tissue]
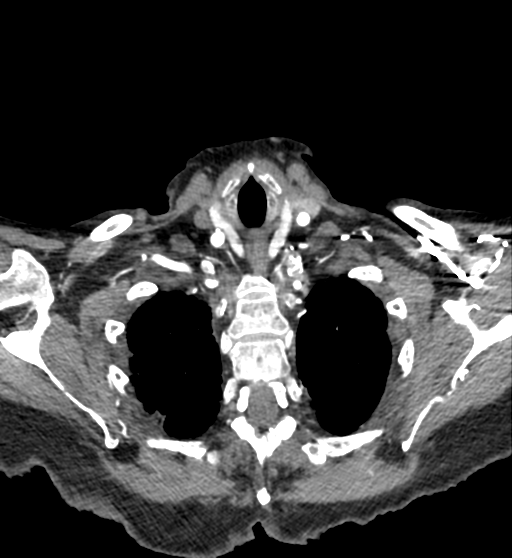
[im 165/248  bone]
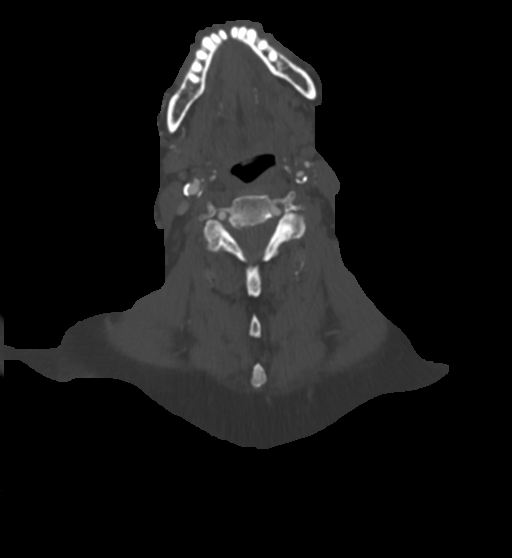
[im 206/248  soft-tissue]
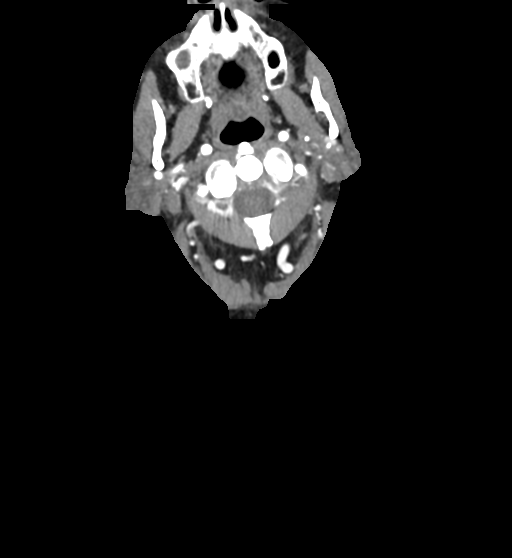

[Series 15: cta neck 1.00 bv48 s3 sag sag thin mips · sagittal · 0.48mm/px · 3 of 259 slices shown]
[im 27/259  soft-tissue]
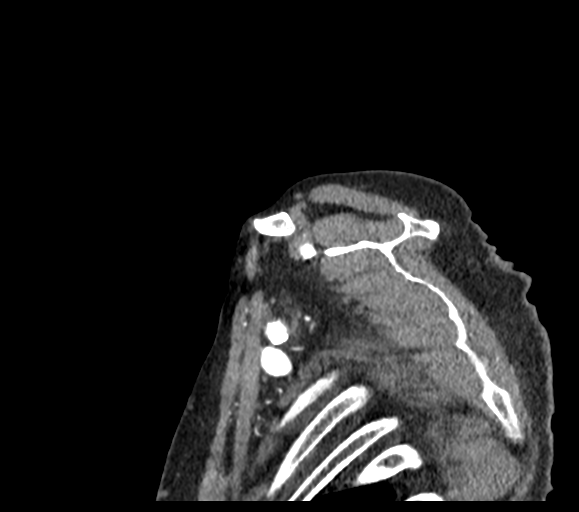
[im 130/259  soft-tissue]
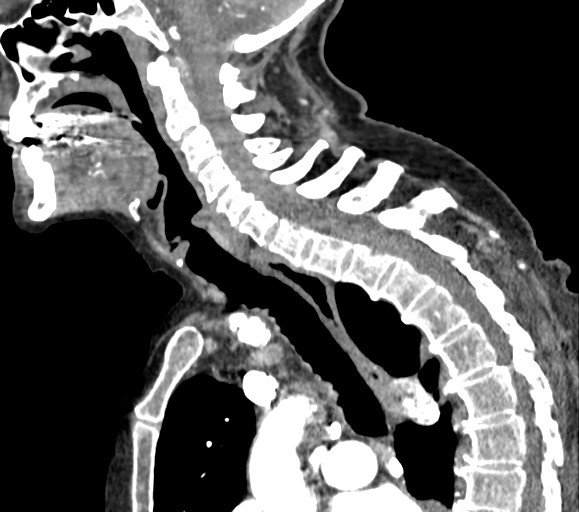
[im 233/259  soft-tissue]
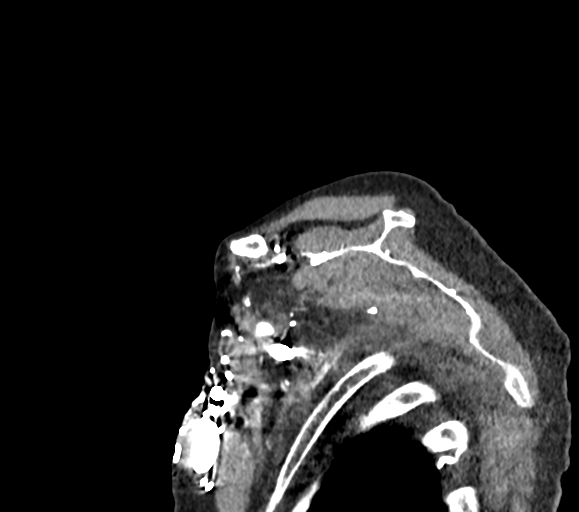

[10 of 35 positions shown; findings below may reference images not displayed]

FINDINGS: Aortic arch: Confluent atheromatous wall thickening and scattered
calcification. Three vessel branching.

Right carotid system: Extensive brachiocephalic plaque. There is
diffuse atheromatous wall thickening of the common carotid with
moderate to bulky plaque at the ICA bulb where luminal stenosis
measures 40% on coronal reformats. No dissection or ulceration.

Left carotid system: Extensive atheromatous plaque of the common
carotid with bulky plaque at the ICA bulb causing luminal stenosis
of ~70% as measured on sagittal reformats. No dissection or
ulceration seen.

Vertebral arteries: Extensive subclavian atherosclerosis without
flow limiting stenosis proximal to the vertebral arteries. Plaque at
the bilateral vertebral ostia cause stenosis measuring up to ~80%
on both sides. The V2 and V3 segments are widely patent. Bilateral
V4 segment atheromatous plaque with up to 50% narrowing on the right
and over 50% on the left where there is difficulty with accurate
measurement due to irregular plaque shape and small vessel size.

Skeleton: Exaggerated thoracic kyphosis.

Other neck: No noted mass or inflammation.

Upper chest: Emphysema and remote granulomatous disease.
IMPRESSION: 1. Advanced generalized atherosclerosis.
2. ~80% narrowing at both vertebral ostia. ~50% right and over
50% left V4 segment stenosis.
3. ~70% atheromatous narrowing at the left ICA bulb.
4. No flow limiting stenosis in the right cervical carotid
circulation.
5.  Emphysema ([id number]).

## 2020-10-03 DEATH — deceased
